# Patient Record
Sex: Male | Born: 1935 | Race: White | Hispanic: No | Marital: Married | Smoking: Former smoker
Health system: Southern US, Community
[De-identification: ages and names within clinical notes are randomized; demographics above are authoritative.]

## PROBLEM LIST (undated history)

## (undated) DIAGNOSIS — Z973 Presence of spectacles and contact lenses: Secondary | ICD-10-CM

## (undated) DIAGNOSIS — I10 Essential (primary) hypertension: Secondary | ICD-10-CM

## (undated) DIAGNOSIS — H269 Unspecified cataract: Secondary | ICD-10-CM

## (undated) DIAGNOSIS — M1990 Unspecified osteoarthritis, unspecified site: Secondary | ICD-10-CM

## (undated) DIAGNOSIS — J189 Pneumonia, unspecified organism: Secondary | ICD-10-CM

## (undated) HISTORY — PX: MULTIPLE TOOTH EXTRACTIONS: SHX2053

## (undated) HISTORY — PX: NASAL FRACTURE SURGERY: SHX718

---

## 2009-04-30 ENCOUNTER — Emergency Department: Payer: Self-pay | Admitting: Emergency Medicine

## 2013-04-13 ENCOUNTER — Emergency Department (HOSPITAL_BASED_OUTPATIENT_CLINIC_OR_DEPARTMENT_OTHER)
Admission: EM | Admit: 2013-04-13 | Discharge: 2013-04-13 | Disposition: A | Discharge status: Home / Self Care | Payer: Medicare Other | Attending: Emergency Medicine | Admitting: Emergency Medicine

## 2013-04-13 ENCOUNTER — Encounter (HOSPITAL_BASED_OUTPATIENT_CLINIC_OR_DEPARTMENT_OTHER): Payer: Self-pay | Admitting: Family Medicine

## 2013-04-13 DIAGNOSIS — I872 Venous insufficiency (chronic) (peripheral): Secondary | ICD-10-CM | POA: Insufficient documentation

## 2013-04-13 DIAGNOSIS — R21 Rash and other nonspecific skin eruption: Secondary | ICD-10-CM | POA: Insufficient documentation

## 2013-04-13 DIAGNOSIS — L03119 Cellulitis of unspecified part of limb: Secondary | ICD-10-CM | POA: Insufficient documentation

## 2013-04-13 DIAGNOSIS — L03115 Cellulitis of right lower limb: Secondary | ICD-10-CM

## 2013-04-13 DIAGNOSIS — I878 Other specified disorders of veins: Secondary | ICD-10-CM

## 2013-04-13 DIAGNOSIS — L97809 Non-pressure chronic ulcer of other part of unspecified lower leg with unspecified severity: Secondary | ICD-10-CM | POA: Insufficient documentation

## 2013-04-13 DIAGNOSIS — L02419 Cutaneous abscess of limb, unspecified: Secondary | ICD-10-CM | POA: Insufficient documentation

## 2013-04-13 MED ORDER — TRAMADOL HCL 50 MG PO TABS: 50.0000 mg | ORAL_TABLET | Freq: Four times a day (QID) | ORAL | Status: DC | PRN

## 2013-04-13 MED ORDER — SULFAMETHOXAZOLE-TMP DS 800-160 MG PO TABS
1.0000 | ORAL_TABLET | Freq: Once | ORAL | Status: AC
  Administered 2013-04-13: 1 via ORAL
  Filled 2013-04-13: qty 1

## 2013-04-13 MED ORDER — CEPHALEXIN 500 MG PO CAPS: 500.0000 mg | ORAL_CAPSULE | Freq: Four times a day (QID) | ORAL | Status: DC

## 2013-04-13 MED ORDER — CEPHALEXIN 250 MG PO CAPS
500.0000 mg | ORAL_CAPSULE | Freq: Once | ORAL | Status: AC
  Administered 2013-04-13: 500 mg via ORAL
  Filled 2013-04-13: qty 2

## 2013-04-13 MED ORDER — SULFAMETHOXAZOLE-TRIMETHOPRIM 800-160 MG PO TABS: 1.0000 | ORAL_TABLET | Freq: Two times a day (BID) | ORAL | Status: DC

## 2013-04-13 NOTE — ED Notes (Signed)
MD at bedside. 

## 2013-04-13 NOTE — ED Notes (Signed)
Snack and PO fluids provided.

## 2013-04-13 NOTE — ED Provider Notes (Signed)
History    CSN: [id number] Arrival date & time 04/13/13  1354  First MD Initiated Contact with Patient 04/13/13 1409     Chief Complaint  Patient presents with  . Leg Problem   (Consider location/radiation/quality/duration/timing/severity/associated sxs/prior Treatment) HPI  78 year old male presents for evaluations of lower leg pain and redness. Patient reports 7 or 8 months ago he was walking down the ER when a "handful of beetles attack both of my legs and bite it".  Subsequent to the incident he notice pain and swelling to both lower legs. Describe pain as sharp and burning sensation, persistent, with associated swelling to his feet. He has been seen and evaluated for this complaint both at the ER and currently at wound care center.  States he's been treated for cellulitis with both IV antibiotics and oral antibiotics. Last antibiotic use was a week ago, when he took Keflex for 10 days. States antibiotic has helped with his symptoms but the symptom has not fully resolved. He visits wound care center on a weekly basis, last visit was yesterday where they released him because the symptom has "gotten better". Patient is here because he felt his infection has not fully resolved and request for further treatment. Patient otherwise denies fever, chest pain, shortness of breath, worsening redness. He takes Advil on occasion for pain. Patient is currently living out of his [employer] due to inability to pay rent.     History reviewed. No pertinent past medical history. History reviewed. No pertinent past surgical history. No family history on file. History  Substance Use Topics  . Smoking status: Never Smoker   . Smokeless tobacco: Not on file  . Alcohol Use: No    Review of Systems  Constitutional: Negative for fever.  HENT: Negative for neck pain.   Respiratory: Negative for shortness of breath.   Cardiovascular: Negative for chest pain.  Gastrointestinal: Negative for nausea, vomiting and  abdominal pain.  Skin: Positive for rash and wound.  All other systems reviewed and are negative.    Allergies  Review of patient's allergies indicates no known allergies.  Home Medications  No current outpatient prescriptions on file. BP 167/82  Pulse 80  Temp(Src) 98 F (36.7 C) (Oral)  Resp 20  SpO2 100% Physical Exam  Nursing note and vitals reviewed. Constitutional: He is oriented to person, place, and time. He appears well-developed and well-nourished. No distress.  HENT:  Head: Normocephalic and atraumatic.  Mouth/Throat: Oropharynx is clear and moist.  Eyes: Conjunctivae are normal.  Neck: Neck supple.  Cardiovascular: Normal rate and regular rhythm.   Pulmonary/Chest: Effort normal and breath sounds normal. He has no wheezes. He exhibits no tenderness.  Abdominal: Soft.  Neurological: He is alert and oriented to person, place, and time.  Skin: Rash (bilateral lower extremities with cellulitic skin changes that is circumferential L>R.  a dime size ulceration noted to R anterior lower leg, no pus.  edema noted to both foot.  both ankle nontender.  DP palpable) noted.    ED Course  Procedures (including critical care time)  2:48 PM Pt with apparent skin injury from beatle bites thus introducing infection including cellulitis to both lower legs.  Sxs seems to be improving but not fully resolved.  Pain has also improved but not resolved.  Cellulitic changes noted to both legs.  Doubt osteo as pt has no fever, no worsening of sxs.  He is primarily here because wound care center released him yesterday after removing his dressing and  reapplied zinc oxide cream.  Pt is NVI, and no evidence of joint involvement.  My plan is to start pt on keflex/bactrim, give pain medication, and have pt return to wound care specialist for further management as needed.  Cellulitic skin changes was marked with marking pen.  Pt should return in 2 days if no improvement.  Care discussed with  attending.    2:58 PM I have reviewed pt prior record, when he was admitted a month ago.  The note indicate pt is likely having chronic venous stasis with possible cellulitis.  Pt is to continue using Ulna boot, take clindamycin, and f/u at the W[facility] Care Clinic.  Note was through Dr. Gery Pray.  I therefore strongly recommend pt to follow prior instruction and to return to his doctor for further care.    Labs Reviewed - No data to display No results found. 1. Venous stasis of lower extremity   2. Bilateral lower leg cellulitis     MDM  BP 167/82  Pulse 80  Temp(Src) 98 F (36.7 C) (Oral)  Resp 20  SpO2 100%  I have reviewed nursing notes and vital signs.   I reviewed available ER/hospitalization records thought the EMR   Fayrene Helper, [place] 04/13/13 1532

## 2013-04-13 NOTE — ED Notes (Signed)
Pt reports he has been inpatient at [facility] intermittently for several months.  After d/c pt has been in a skilled nursing facility for rehab and following up with the wound care center in [facility].  States he was d/c'd from wound care center yesterday but wounds on bilateral LE are not healing.  Pt is presently homeless and living in his [employer]. Records requested from [facility].

## 2013-04-13 NOTE — ED Notes (Signed)
Pt c/o bilateral lower leg red and swollen and has been treated for wounds at [facility] but released yesterday.

## 2013-04-13 NOTE — ED Notes (Signed)
Bilateral LE wrapped with ace wraps for compression.  Bacitracin applied to dime sized open area on anterior aspect of leg.

## 2013-04-14 NOTE — ED Provider Notes (Signed)
Medical screening examination/treatment/procedure(s) were performed by non-physician practitioner and as supervising physician I was immediately available for consultation/collaboration.   Charles B. Sheldon, MD 04/14/13 1104 

## 2013-04-17 ENCOUNTER — Emergency Department (HOSPITAL_BASED_OUTPATIENT_CLINIC_OR_DEPARTMENT_OTHER): Payer: Medicare Other

## 2013-04-17 ENCOUNTER — Encounter (HOSPITAL_BASED_OUTPATIENT_CLINIC_OR_DEPARTMENT_OTHER): Payer: Self-pay | Admitting: Emergency Medicine

## 2013-04-17 ENCOUNTER — Other Ambulatory Visit (HOSPITAL_BASED_OUTPATIENT_CLINIC_OR_DEPARTMENT_OTHER): Payer: Medicare Other

## 2013-04-17 ENCOUNTER — Emergency Department (HOSPITAL_BASED_OUTPATIENT_CLINIC_OR_DEPARTMENT_OTHER)
Admission: EM | Admit: 2013-04-17 | Discharge: 2013-04-18 | Disposition: A | Discharge status: Home / Self Care | Payer: Medicare Other | Attending: Emergency Medicine | Admitting: Emergency Medicine

## 2013-04-17 DIAGNOSIS — N289 Disorder of kidney and ureter, unspecified: Secondary | ICD-10-CM

## 2013-04-17 DIAGNOSIS — I8310 Varicose veins of unspecified lower extremity with inflammation: Secondary | ICD-10-CM | POA: Insufficient documentation

## 2013-04-17 DIAGNOSIS — L539 Erythematous condition, unspecified: Secondary | ICD-10-CM | POA: Insufficient documentation

## 2013-04-17 DIAGNOSIS — I872 Venous insufficiency (chronic) (peripheral): Secondary | ICD-10-CM

## 2013-04-17 DIAGNOSIS — Z87891 Personal history of nicotine dependence: Secondary | ICD-10-CM | POA: Insufficient documentation

## 2013-04-17 LAB — CBC WITH DIFFERENTIAL/PLATELET
Basophils Absolute: 0.1 10*3/uL (ref 0.0–0.1)
Basophils Relative: 1 % (ref 0–1)
Eosinophils Absolute: 0.1 10*3/uL (ref 0.0–0.7)
HCT: 38.9 % — ABNORMAL LOW (ref 39.0–52.0)
Hemoglobin: 13 g/dL (ref 13.0–17.0)
Lymphocytes Relative: 17 % (ref 12–46)
MCHC: 33.4 g/dL (ref 30.0–36.0)
Monocytes Relative: 12 % (ref 3–12)
Neutro Abs: 5.7 10*3/uL (ref 1.7–7.7)
Neutrophils Relative %: 69 % (ref 43–77)
WBC: 8.3 10*3/uL (ref 4.0–10.5)

## 2013-04-17 LAB — COMPREHENSIVE METABOLIC PANEL
ALT: 16 U/L (ref 0–53)
Albumin: 3.9 g/dL (ref 3.5–5.2)
Alkaline Phosphatase: 62 U/L (ref 39–117)
Chloride: 99 mEq/L (ref 96–112)
Glucose, Bld: 95 mg/dL (ref 70–99)
Potassium: 4.5 mEq/L (ref 3.5–5.1)
Sodium: 138 mEq/L (ref 135–145)
Total Bilirubin: 0.3 mg/dL (ref 0.3–1.2)
Total Protein: 7.1 g/dL (ref 6.0–8.3)

## 2013-04-17 NOTE — Discharge Instructions (Signed)
Venous Stasis and Chronic Venous Insufficiency Continue to take the antibiotics. Followup with the wound center as scheduled. Have your doctor recheck your kidney function next week. Return to the ED if you develop new or worsening symptoms. As people age, the veins located in their legs may weaken and stretch. When veins weaken and lose the ability to pump blood effectively, the condition is called chronic venous insufficiency (CVI) or venous stasis. Almost all veins return blood back to the heart. This happens by:  The force of the heart pumping fresh blood pushes blood back to the heart.  Blood flowing to the heart from the force of gravity. In the deep veins of the legs, blood has to fight gravity and flow upstream back to the heart. Here, the leg muscles contract to pump blood back toward the heart. Vein walls are elastic, and many veins have small valves that only allow blood to flow in one direction. When leg muscles contract, they push inward against the elastic vein walls. This squeezes blood upward, opens the valves, and moves blood toward the heart. When leg muscles relax, the vein wall also relaxes and the valves inside the vein close to prevent blood from flowing backward. This method of pumping blood out of the legs is called the venous pump. CAUSES  The venous pump works best while walking and leg muscles are contracting. But when a person sits or stands, blood pressure in leg veins can build. Deep veins are usually able to withstand short periods of inactivity, but long periods of inactivity (and increased pressure) can stretch, weaken, and damage vein walls. High blood pressure can also stretch and damage vein walls. The veins may no longer be able to pump blood back to the heart. Venous hypertension (high blood pressure inside veins) that lasts over time is a primary cause of CVI. CVI can also be caused by:   Deep vein thrombosis, a condition where a thrombus (blood clot) blocks blood  flow in a vein.  Phlebitis, an inflammation of a superficial vein that causes a blood clot to form. Other risk factors for CVI may include:   Heredity.  Obesity.  Pregnancy.  Sedentary lifestyle.  Smoking.  Jobs requiring long periods of standing or sitting in one place.  Age and gender:  Women in their 22's and 9's and men in their 77's are more prone to developing CVI. SYMPTOMS  Symptoms of CVI may include:   Varicose veins.  Ulceration or skin breakdown.  Lipodermatosclerosis, a condition that affects the skin just above the ankle, usually on the inside surface. Over time the skin becomes brown, smooth, tight and often painful. Those with this condition have a high risk of developing skin ulcers.  Reddened or discolored skin on the leg.  Swelling. DIAGNOSIS  Your caregiver can diagnose CVI after performing a careful medical history and physical examination. To confirm the diagnosis, the following tests may also be ordered:   Duplex ultrasound.  Plethysmography (tests blood flow).  Venograms (x-ray using a special dye). TREATMENT The goals of treatment for CVI are to restore a person to an active life and to minimize pain or disability. Typically, CVI does not pose a serious threat to life or limb, and with proper treatment most people with this condition can continue to lead active lives. In most cases, mild CVI can be treated on an outpatient basis with simple procedures. Treatment methods include:   Elastic compression socks.  Sclerotherapy, a procedure involving an injection of a  material that "dissolves" the damaged veins. Other veins in the network of blood vessels take over the function of the damaged veins.  Vein stripping (an older procedure less commonly used).  Laser Ablation surgery.  Valve repair. HOME CARE INSTRUCTIONS   Elastic compression socks must be worn every day. They can help with symptoms and lower the chances of the problem getting  worse, but they do not cure the problem.  Only take over-the-counter or prescription medicines for pain, discomfort, or fever as directed by your caregiver.  Your caregiver will review your other medications with you. SEEK MEDICAL CARE IF:   You are confused about how to take your medications.  There is redness, swelling, or increasing pain in the affected area.  There is a red streak or line that extends up or down from the affected area.  There is a breakdown or loss of skin in the affected area, even if the breakdown is small.  You develop an unexplained oral temperature above 102 F (38.9 C).  There is an injury to the affected area. SEEK IMMEDIATE MEDICAL CARE IF:   There is an injury and open wound to the affected area.  Pain is not adequately relieved with pain medication prescribed or becomes severe.  An oral temperature above 102 F (38.9 C) develops.  The foot/ankle below the affected area becomes suddenly numb or the area feels weak and hard to move. MAKE SURE YOU:   Understand these instructions.  Will watch your condition.  Will get help right away if you are not doing well or get worse. Document Released: 01/26/2007 Document Revised: 12/15/2011 Document Reviewed: 04/05/2007 [facility] Patient Information 2014 [place], [place].

## 2013-04-17 NOTE — ED Notes (Signed)
Patient transported to Ultrasound 

## 2013-04-17 NOTE — ED Notes (Signed)
Pt returned from xray via stretcher. Pt appears in no distress.

## 2013-04-17 NOTE — ED Provider Notes (Signed)
History  This chart was scribed for Gerald Octave, MD, by Yevette Edwards, ED Scribe. This patient was seen in room MH10/MH10 and the patient's care was started at 8:39 PM.  CSN: [id number] Arrival date & time 04/17/13  7829  First MD Initiated Contact with Patient 04/17/13 2029     Chief Complaint  Patient presents with  . Wound Check    The history is provided by the patient. No language interpreter was used.   HPI Comments: Gerald Lewis is a 78 y.o. male who presents to the Emergency Department complaining of chronic bilateral wounds to his lower extremities which have been consistently red and swollen. The pt states that his legs were bitten by multiple beetles several months ago, and he subsequently developed cellulitis. He states that the wounds have been draining, though they were not draining at bedside. He also states that his wounds have neither worsened nor improved recently, and that the redness of his legs are baseline since the wounds occurred.He denies nausea, emesis, fever, chest pain, or SOB. He reports that he has been admitted to [facility] three times, with his last admission approximately one month ago. He also reports that he has also visited the Wound Center as well as this ED recently where he received antibiotics.  The pt has an appointment with the Wound Center in two days.  He denies a h/o of DM. He is a former smoker, and he denies using alcohol.   History reviewed. No pertinent past medical history. History reviewed. No pertinent past surgical history. No family history on file. History  Substance Use Topics  . Smoking status: Former Games developer  . Smokeless tobacco: Not on file  . Alcohol Use: No    Review of Systems  Constitutional: Negative for fever and chills.  Respiratory: Negative for shortness of breath.   Cardiovascular: Negative for chest pain.  Gastrointestinal: Negative for nausea and vomiting.  Skin: Positive for color change  and wound.  All other systems reviewed and are negative.    Allergies  Review of patient's allergies indicates no known allergies.  Home Medications   Current Outpatient Rx  Name  Route  Sig  Dispense  Refill  . cephALEXin (KEFLEX) 500 MG capsule   Oral   Take 1 capsule (500 mg total) by mouth 4 (four) times daily.   28 capsule   0   . sulfamethoxazole-trimethoprim (SEPTRA DS) 800-160 MG per tablet   Oral   Take 1 tablet by mouth every 12 (twelve) hours.   10 tablet   0   . traMADol (ULTRAM) 50 MG tablet   Oral   Take 1 tablet (50 mg total) by mouth every 6 (six) hours as needed for pain.   15 tablet   0    Triage Vitals: BP 128/65  Pulse 94  Temp(Src) 97.7 F (36.5 C) (Oral)  Resp 16  SpO2 97%  Physical Exam  Nursing note and vitals reviewed. Constitutional: He is oriented to person, place, and time. He appears well-developed and well-nourished. No distress.  HENT:  Head: Normocephalic and atraumatic.  Eyes: EOM are normal.  Neck: Normal range of motion. Neck supple. No tracheal deviation present.  Cardiovascular: Normal rate.   Pulmonary/Chest: Effort normal. No respiratory distress.  Abdominal: Soft. There is no tenderness.  Musculoskeletal: Normal range of motion.  Neurological: He is alert and oriented to person, place, and time.  Skin: Skin is warm and dry. There is erythema.  Right leg: Mild,  circumferential erythema of medial mid tibia, circumferential. 1 cm denuded blister on anterior shin. Intact DP and PT pulses.   Left leg: Circumferential erythema of distal third of tibia with crusting legions consistent with venous stasis changes. + 2 DP pulse.   Psychiatric: He has a normal mood and affect. His behavior is normal.    ED Course  Procedures (including critical care time)  DIAGNOSTIC STUDIES: Oxygen Saturation is 97% on room air, normal by my interpretation.    COORDINATION OF CARE:  8:40 PM-Discussed treatment plan with patient, and the  patient agreed to the plan.    Labs Reviewed  CBC WITH DIFFERENTIAL - Abnormal; Notable for the following:    HCT 38.9 (*)    All other components within normal limits  COMPREHENSIVE METABOLIC PANEL - Abnormal; Notable for the following:    BUN 24 (*)    Creatinine, Ser 1.80 (*)    GFR calc non Af Amer 35 (*)    GFR calc Af Amer 40 (*)    All other components within normal limits   Dg Tibia/fibula Left  04/17/2013   *RADIOLOGY REPORT*  Clinical Data: Wound check.  LEFT TIBIA AND FIBULA - 2 VIEW  Comparison: None.  Findings: Two views of the left lower leg were obtained.  Joint space narrowing and degenerative changes in the medial knee compartment.  Negative for fracture or dislocation.  No evidence for a radiopaque foreign body. Small spur on the calcaneus. Enthesopathic changes involving the calcaneus.  IMPRESSION: No acute bony abnormality.  Degenerative changes in the left knee.   Original Report Authenticated By: Richarda Overlie, M.D.   Dg Tibia/fibula Right  04/17/2013   *RADIOLOGY REPORT*  Clinical Data: Wound check.  RIGHT TIBIA AND FIBULA - 2 VIEW  Comparison: None.  Findings: Two views of the right lower leg were obtained. Degenerative changes in the medial knee compartment.  No evidence for a fracture or dislocation.  No evidence for a radiopaque foreign body.  IMPRESSION: No acute bony abnormality.  Degenerative changes in the right knee.   Original Report Authenticated By: Richarda Overlie, M.D.   US Venous Img Lower Bilateral  04/17/2013   *RADIOLOGY REPORT*  Clinical Data: Leg pain and swelling.  VENOUS DUPLEX ULTRASOUND OF BILATERAL LOWER EXTREMITIES  Technique:  Gray-scale sonography with graded compression, as well as color Doppler and duplex ultrasound, were performed to evaluate the deep venous system of both lower extremities from the level of the common femoral vein through the popliteal and proximal calf veins.  Spectral Doppler was utilized to evaluate flow at rest and with distal  augmentation maneuvers.  Comparison:  None.  Findings: Bilaterally, the lower common femoral vein, upper great saphenous vein, upper femoral profunda, superficial femoral vein, and popliteal veins, are patent by gray scale with compression, spectral tracing with augmentation, and color Doppler - as permitted by patient anatomy. Evaluation of the upper calf veins limited due to open wounds on the calves.   Preserved respiratory phasicity.  IMPRESSION: Negative for deep venous thrombosis in either lower extremity.   Original Report Authenticated By: Tiburcio Pea   No diagnosis found.  MDM  Chronic lower extremity wounds for which he follows at the wound Center. Seen 3 days ago and started on Bactrim and Keflex. Has wound Center followup in 2 days  Records obtained from [facility]. He was admitted on June 2 for venous stasis ulcers with possible cellulitis. It is recommended that he follow up with vascular surgery for varicose  vein removal.   No evidence of DVT on ultrasound today. No bony abnormality. Patient's dressings were removed and appeared to be quite tight. Erythema improved after dressing removal. Wounds do not appear to be any worse than description 3 days ago. Patient is hemodynamically stable and able to follow up at the wound center as scheduled. He will need recheck of his creatinine which is elevated today at 1.8.  I personally performed the services described in this documentation, which was scribed in my presence. The recorded information has been reviewed and is accurate.    Gerald Octave, MD 04/18/13 [phone]

## 2013-04-17 NOTE — ED Notes (Addendum)
Pt here for wound check; was seen here on 7/9 and referred to a wound care center; has an appt on Tues. Pt is concerned because he is not able to provide wound care d/t he is living in his [employer]. The dressings on BLE have not been changed since 7/9.

## 2013-04-17 NOTE — ED Notes (Signed)
Assigned RN and MD at bedside on vital sign re-assessment

## 2013-07-06 ENCOUNTER — Emergency Department (HOSPITAL_BASED_OUTPATIENT_CLINIC_OR_DEPARTMENT_OTHER)
Admission: EM | Admit: 2013-07-06 | Discharge: 2013-07-06 | Disposition: A | Discharge status: Home / Self Care | Payer: Medicare Other | Attending: Emergency Medicine | Admitting: Emergency Medicine

## 2013-07-06 ENCOUNTER — Encounter (HOSPITAL_BASED_OUTPATIENT_CLINIC_OR_DEPARTMENT_OTHER): Payer: Self-pay | Admitting: Emergency Medicine

## 2013-07-06 DIAGNOSIS — Z5189 Encounter for other specified aftercare: Secondary | ICD-10-CM

## 2013-07-06 DIAGNOSIS — Z4800 Encounter for change or removal of nonsurgical wound dressing: Secondary | ICD-10-CM | POA: Insufficient documentation

## 2013-07-06 DIAGNOSIS — I10 Essential (primary) hypertension: Secondary | ICD-10-CM | POA: Insufficient documentation

## 2013-07-06 DIAGNOSIS — Z87891 Personal history of nicotine dependence: Secondary | ICD-10-CM | POA: Insufficient documentation

## 2013-07-06 HISTORY — DX: Essential (primary) hypertension: I10

## 2013-07-06 LAB — BASIC METABOLIC PANEL
CO2: 26 mEq/L (ref 19–32)
Calcium: 8.7 mg/dL (ref 8.4–10.5)
Creatinine, Ser: 1 mg/dL (ref 0.50–1.35)
GFR calc non Af Amer: 71 mL/min — ABNORMAL LOW (ref >90)
Glucose, Bld: 106 mg/dL — ABNORMAL HIGH (ref 70–99)

## 2013-07-06 MED ORDER — LISINOPRIL-HYDROCHLOROTHIAZIDE 20-25 MG PO TABS: 1.0000 | ORAL_TABLET | Freq: Every day | ORAL | Status: DC

## 2013-07-06 NOTE — ED Provider Notes (Signed)
CSN: [id number]     Arrival date & time 07/06/13  1627 History   First MD Initiated Contact with Patient 07/06/13 1640     Chief Complaint  Patient presents with  . Wound Check   (Consider location/radiation/quality/duration/timing/severity/associated sxs/prior Treatment) HPI Comments: Pt states that he has been being seen by the wound clinic for ulcer and they took him out of the unaboot and told him to get stockings:pt states that he wants a second opinion on whether he needs the stocking:pt is also here to have his bp medication refilled:he hasn't had it in one week:denies cp or sob  The history is provided by the patient. No language interpreter was used.    Past Medical History  Diagnosis Date  . Hypertension    Past Surgical History  Procedure Laterality Date  . Nasal fracture surgery     History reviewed. No pertinent family history. History  Substance Use Topics  . Smoking status: Former Games developer  . Smokeless tobacco: Not on file  . Alcohol Use: No    Review of Systems  Constitutional: Negative.   Respiratory: Negative.   Cardiovascular: Negative.     Allergies  Review of patient's allergies indicates no known allergies.  Home Medications   Current Outpatient Rx  Name  Route  Sig  Dispense  Refill  . lisinopril-hydrochlorothiazide (PRINZIDE,ZESTORETIC) 20-25 MG per tablet   Oral   Take 1 tablet by mouth daily.   30 tablet   1    BP 195/83  Pulse 98  Temp(Src) 97.9 F (36.6 C) (Oral)  Resp 18  Wt 220 lb (99.791 kg)  SpO2 98% Physical Exam  Nursing note and vitals reviewed. Constitutional: He appears well-developed and well-nourished.  Cardiovascular: Normal rate and regular rhythm.   Pulmonary/Chest: Effort normal and breath sounds normal.  Musculoskeletal: Normal range of motion.  Skin:  Redness noted to lower legs bilaterally:no drainage or wounds noted    ED Course  Procedures (including critical care time) Labs Review Labs Reviewed  BASIC  METABOLIC PANEL - Abnormal; Notable for the following:    Glucose, Bld 106 (*)    GFR calc non Af Amer 71 (*)    GFR calc Af Amer 82 (*)    All other components within normal limits   Imaging Review No results found.  MDM   1. HTN (hypertension)   2. Visit for wound check    Will give pt a script for his bp medication    Teressa Lower, NP 07/06/13 1756

## 2013-07-06 NOTE — ED Notes (Signed)
NP at bedside.

## 2013-07-06 NOTE — ED Notes (Signed)
Pt seen here and prescribed antbx for lower extremity redness and swelling. Also followed up w/wound center. Had Unaboots removed yesterday. Also concerned about HTN follow up

## 2013-07-07 NOTE — ED Provider Notes (Signed)
Medical screening examination/treatment/procedure(s) were performed by non-physician practitioner and as supervising physician I was immediately available for consultation/collaboration.  Shon Baton, MD 07/07/13 [phone]

## 2017-03-11 ENCOUNTER — Encounter (HOSPITAL_COMMUNITY): Payer: Self-pay

## 2017-03-11 ENCOUNTER — Ambulatory Visit (HOSPITAL_COMMUNITY)
Admission: RE | Admit: 2017-03-11 | Discharge: 2017-03-11 | Disposition: A | Discharge status: Home / Self Care | Payer: Medicare Other | Source: Ambulatory Visit | Attending: Orthopaedic Surgery | Admitting: Orthopaedic Surgery

## 2017-03-11 ENCOUNTER — Encounter (HOSPITAL_COMMUNITY)
Admission: RE | Admit: 2017-03-11 | Discharge: 2017-03-11 | Disposition: A | Discharge status: Home / Self Care | Payer: Medicare Other | Source: Ambulatory Visit | Attending: Orthopaedic Surgery | Admitting: Orthopaedic Surgery

## 2017-03-11 DIAGNOSIS — Z01818 Encounter for other preprocedural examination: Secondary | ICD-10-CM | POA: Diagnosis present

## 2017-03-11 DIAGNOSIS — Z01810 Encounter for preprocedural cardiovascular examination: Secondary | ICD-10-CM | POA: Diagnosis not present

## 2017-03-11 DIAGNOSIS — Z01812 Encounter for preprocedural laboratory examination: Secondary | ICD-10-CM | POA: Insufficient documentation

## 2017-03-11 HISTORY — DX: Pneumonia, unspecified organism: J18.9

## 2017-03-11 HISTORY — DX: Unspecified osteoarthritis, unspecified site: M19.90

## 2017-03-11 HISTORY — DX: Presence of spectacles and contact lenses: Z97.3

## 2017-03-11 HISTORY — DX: Unspecified cataract: H26.9

## 2017-03-11 LAB — SURGICAL PCR SCREEN
MRSA, PCR: NEGATIVE
STAPHYLOCOCCUS AUREUS: NEGATIVE

## 2017-03-11 LAB — ABO/RH: ABO/RH(D): O POS

## 2017-03-11 LAB — BASIC METABOLIC PANEL
Anion gap: 8 (ref 5–15)
BUN: 19 mg/dL (ref 6–20)
CO2: 24 mmol/L (ref 22–32)
CREATININE: 0.96 mg/dL (ref 0.61–1.24)
Calcium: 8.7 mg/dL — ABNORMAL LOW (ref 8.9–10.3)
Chloride: 105 mmol/L (ref 101–111)
GFR calc Af Amer: >60 mL/min (ref >60)
GFR calc non Af Amer: >60 mL/min (ref >60)
GLUCOSE: 93 mg/dL (ref 65–99)
Potassium: 4 mmol/L (ref 3.5–5.1)
Sodium: 137 mmol/L (ref 135–145)

## 2017-03-11 LAB — CBC WITH DIFFERENTIAL/PLATELET
Basophils Absolute: 0 10*3/uL (ref 0.0–0.1)
Basophils Relative: 1 %
EOS ABS: 0.2 10*3/uL (ref 0.0–0.7)
EOS PCT: 3 %
HCT: 36.1 % — ABNORMAL LOW (ref 39.0–52.0)
Hemoglobin: 11.6 g/dL — ABNORMAL LOW (ref 13.0–17.0)
Lymphocytes Relative: 23 %
Lymphs Abs: 1.9 10*3/uL (ref 0.7–4.0)
MCH: 26.5 pg (ref 26.0–34.0)
MCHC: 32.1 g/dL (ref 30.0–36.0)
MCV: 82.6 fL (ref 78.0–100.0)
MONOS PCT: 7 %
Monocytes Absolute: 0.5 10*3/uL (ref 0.1–1.0)
Neutro Abs: 5.5 10*3/uL (ref 1.7–7.7)
Neutrophils Relative %: 66 %
PLATELETS: 366 10*3/uL (ref 150–400)
RBC: 4.37 MIL/uL (ref 4.22–5.81)
RDW: 15.8 % — ABNORMAL HIGH (ref 11.5–15.5)
WBC: 8.2 10*3/uL (ref 4.0–10.5)

## 2017-03-11 LAB — URINALYSIS, ROUTINE W REFLEX MICROSCOPIC
Bilirubin Urine: NEGATIVE
Glucose, UA: NEGATIVE mg/dL
Hgb urine dipstick: NEGATIVE
KETONES UR: NEGATIVE mg/dL
LEUKOCYTES UA: NEGATIVE
NITRITE: NEGATIVE
Protein, ur: NEGATIVE mg/dL
SPECIFIC GRAVITY, URINE: 1.027 (ref 1.005–1.030)
pH: 5 (ref 5.0–8.0)

## 2017-03-11 LAB — APTT: aPTT: 29 seconds (ref 24–36)

## 2017-03-11 LAB — PROTIME-INR
INR: 1.01
PROTHROMBIN TIME: 13.3 s (ref 11.4–15.2)

## 2017-03-11 NOTE — Pre-Procedure Instructions (Signed)
    Gerald Lewis  03/11/2017      [facility] - [place], [place] - [id number] [place] [id number] Phone: [phone] Fax: [phone]   Your procedure is scheduled on Tuesday, March 17, 2017  Report to [facility] Admitting at 8:20 A.M.  Call this number if you have problems the morning of surgery:  [phone]   Remember:  Do not eat food or drink liquids after midnight Monday, March 16, 2017  Take these medicines the morning of surgery with A SIP OF WATER: None Stop taking Aspirin, vitamins, fish oil, Turmeric, Ginkgo Biloba, Saw Palmetto and herbal medications. Do not take any NSAIDs ie: Ibuprofen, Advil, Naproxen, BC and Goody Powder or any medication containing Aspirin; stop now.  Do not wear jewelry, make-up or nail polish.  Do not wear lotions, powders, or perfumes, or deoderant.  Do not shave 48 hours prior to surgery.  Men may shave face and neck.  Do not bring valuables to the hospital.  [facility] is not responsible for any belongings or valuables.  Contacts, dentures or bridgework may not be worn into surgery.  Leave your suitcase in the car.  After surgery it may be brought to your room. For patients admitted to the hospital, discharge time will be determined by your treatment team. Special instructions: Shower the night before surgery and the morning of surgery with CHG. Please read over the following fact sheets that you were given. Pain Booklet, Coughing and Deep Breathing, Blood Transfusion Information, Total Joint Packet, MRSA Information and Surgical Site Infection Prevention

## 2017-03-11 NOTE — Progress Notes (Signed)
Pt denies SOB, chest pain, and being under the care of a cardiologist. Pt denies having a stress test, echo and cardiac cath. Pt denies having an EKG and chest x ray within the last year. Pt denies having recent labs. Pt chart forwarded to anesthesia for review (ekg,cxr ?)

## 2017-03-11 NOTE — H&P (Signed)
TOTAL KNEE ADMISSION H&P  Patient is being admitted for left total knee arthroplasty.  Subjective:  Chief Complaint:left knee pain.  HPI: Gerald Lewis, 82 y.o. male, has a history of pain and functional disability in the left knee due to arthritis and has failed non-surgical conservative treatments for greater than 12 weeks to includeNSAID's and/or analgesics, corticosteriod injections, flexibility and strengthening excercises, use of assistive devices, weight reduction as appropriate and activity modification.  Onset of symptoms was gradual, starting 5 years ago with gradually worsening course since that time. The patient noted no past surgery on the left knee(s).  Patient currently rates pain in the left knee(s) at 10 out of 10 with activity. Patient has night pain, worsening of pain with activity and weight bearing, pain that interferes with activities of daily living, crepitus and joint swelling.  Patient has evidence of subchondral cysts, subchondral sclerosis, periarticular osteophytes and joint space narrowing by imaging studies. There is no active infection.  There are no active problems to display for this patient.  Past Medical History:  Diagnosis Date  . Hypertension     Past Surgical History:  Procedure Laterality Date  . NASAL FRACTURE SURGERY      No prescriptions prior to admission.   No Known Allergies  Social History  Substance Use Topics  . Smoking status: Former Games developer  . Smokeless tobacco: Not on file  . Alcohol use No    No family history on file.   Review of Systems  Musculoskeletal: Positive for joint pain.       Left knee  All other systems reviewed and are negative.   Objective:  Physical Exam  Constitutional: He is oriented to person, place, and time. He appears well-developed and well-nourished.  HENT:  Head: Normocephalic and atraumatic.  Eyes: Pupils are equal, round, and reactive to light.  Neck: Normal range of motion.  Cardiovascular:  Normal rate and regular rhythm.   Respiratory: Effort normal.  GI: Soft.  Musculoskeletal:  Examination of the left knee shows range of motion from 0 to about 100 of flexion.  He is tender to palpation along the medial joint line.  No significant effusion.  Normal sensation motor function throughout his leg.  He is neurovascularly intact distally.    Neurological: He is alert and oriented to person, place, and time.  Skin: Skin is warm and dry.  Psychiatric: He has a normal mood and affect. His behavior is normal. Judgment and thought content normal.    Vital signs in last 24 hours:    Labs:   There is no height or weight on file to calculate BMI.   Imaging Review Plain radiographs demonstrate severe degenerative joint disease of the left knee(s). The overall alignment isneutral. The bone quality appears to be good for age and reported activity level.  Assessment/Plan:  End stage primary arthritis, left knee   The patient history, physical examination, clinical judgment of the provider and imaging studies are consistent with end stage degenerative joint disease of the left knee(s) and total knee arthroplasty is deemed medically necessary. The treatment options including medical management, injection therapy arthroscopy and arthroplasty were discussed at length. The risks and benefits of total knee arthroplasty were presented and reviewed. The risks due to aseptic loosening, infection, stiffness, patella tracking problems, thromboembolic complications and other imponderables were discussed. The patient acknowledged the explanation, agreed to proceed with the plan and consent was signed. Patient is being admitted for inpatient treatment for surgery, pain control, PT, OT,  prophylactic antibiotics, VTE prophylaxis, progressive ambulation and ADL's and discharge planning. The patient is planning to be discharged to skilled nursing facility

## 2017-03-16 MED ORDER — TRANEXAMIC ACID 1000 MG/10ML IV SOLN
2000.0000 mg | INTRAVENOUS | Status: AC
  Administered 2017-03-17: 2000 mg via TOPICAL
  Filled 2017-03-16: qty 20

## 2017-03-16 MED ORDER — TRANEXAMIC ACID 1000 MG/10ML IV SOLN
2000.0000 mg | INTRAVENOUS | Status: DC
  Filled 2017-03-16: qty 20

## 2017-03-17 ENCOUNTER — Inpatient Hospital Stay (HOSPITAL_COMMUNITY)
Admission: RE | Admit: 2017-03-17 | Discharge: 2017-03-20 | DRG: 470 | Disposition: A | Discharge status: Skilled Nursing Facility | Payer: Medicare Other | Source: Ambulatory Visit | Attending: Orthopaedic Surgery | Admitting: Orthopaedic Surgery

## 2017-03-17 ENCOUNTER — Encounter (HOSPITAL_COMMUNITY): Payer: Self-pay | Admitting: Certified Registered"

## 2017-03-17 ENCOUNTER — Inpatient Hospital Stay (HOSPITAL_COMMUNITY): Payer: Medicare Other | Admitting: Certified Registered"

## 2017-03-17 ENCOUNTER — Inpatient Hospital Stay (HOSPITAL_COMMUNITY): Payer: Medicare Other | Admitting: Emergency Medicine

## 2017-03-17 ENCOUNTER — Encounter (HOSPITAL_COMMUNITY)
Admission: RE | Disposition: A | Discharge status: Skilled Nursing Facility | Payer: Self-pay | Source: Ambulatory Visit | Attending: Orthopaedic Surgery

## 2017-03-17 DIAGNOSIS — H269 Unspecified cataract: Secondary | ICD-10-CM | POA: Diagnosis present

## 2017-03-17 DIAGNOSIS — Z79899 Other long term (current) drug therapy: Secondary | ICD-10-CM

## 2017-03-17 DIAGNOSIS — M1712 Unilateral primary osteoarthritis, left knee: Secondary | ICD-10-CM | POA: Diagnosis present

## 2017-03-17 DIAGNOSIS — Z87891 Personal history of nicotine dependence: Secondary | ICD-10-CM

## 2017-03-17 DIAGNOSIS — I10 Essential (primary) hypertension: Secondary | ICD-10-CM | POA: Diagnosis not present

## 2017-03-17 DIAGNOSIS — G8918 Other acute postprocedural pain: Secondary | ICD-10-CM

## 2017-03-17 HISTORY — PX: TOTAL KNEE ARTHROPLASTY: SHX125

## 2017-03-17 SURGERY — ARTHROPLASTY, KNEE, TOTAL
Anesthesia: Regional | Laterality: Left

## 2017-03-17 MED ORDER — MIDAZOLAM HCL 2 MG/2ML IJ SOLN
INTRAMUSCULAR | Status: AC
  Filled 2017-03-17: qty 2

## 2017-03-17 MED ORDER — PHENOL 1.4 % MT LIQD: 1.0000 | OROMUCOSAL | Status: DC | PRN

## 2017-03-17 MED ORDER — CHLORHEXIDINE GLUCONATE 4 % EX LIQD: 60.0000 mL | Freq: Once | CUTANEOUS | Status: DC

## 2017-03-17 MED ORDER — BUPIVACAINE-EPINEPHRINE (PF) 0.5% -1:200000 IJ SOLN
INTRAMUSCULAR | Status: DC | PRN
  Administered 2017-03-17: 20 mL via PERINEURAL

## 2017-03-17 MED ORDER — ONDANSETRON HCL 4 MG PO TABS: 4.0000 mg | ORAL_TABLET | Freq: Four times a day (QID) | ORAL | Status: DC | PRN

## 2017-03-17 MED ORDER — CEFAZOLIN SODIUM-DEXTROSE 2-4 GM/100ML-% IV SOLN
2.0000 g | INTRAVENOUS | Status: AC
  Administered 2017-03-17: 2 g via INTRAVENOUS
  Filled 2017-03-17: qty 100

## 2017-03-17 MED ORDER — SODIUM CHLORIDE 0.9 % IJ SOLN
INTRAMUSCULAR | Status: DC | PRN
  Administered 2017-03-17: 40 mL via INTRAVENOUS

## 2017-03-17 MED ORDER — DOCUSATE SODIUM 100 MG PO CAPS
100.0000 mg | ORAL_CAPSULE | Freq: Two times a day (BID) | ORAL | Status: DC
  Administered 2017-03-17 – 2017-03-20 (×6): 100 mg via ORAL
  Filled 2017-03-17 (×6): qty 1

## 2017-03-17 MED ORDER — BUPIVACAINE IN DEXTROSE 0.75-8.25 % IT SOLN
INTRATHECAL | Status: DC | PRN
  Administered 2017-03-17: 1.6 mL via INTRATHECAL

## 2017-03-17 MED ORDER — HYDROCODONE-ACETAMINOPHEN 5-325 MG PO TABS
1.0000 | ORAL_TABLET | ORAL | Status: DC | PRN
  Administered 2017-03-17 (×2): 1 via ORAL
  Administered 2017-03-18: 2 via ORAL
  Filled 2017-03-17 (×2): qty 1
  Filled 2017-03-17: qty 2
  Filled 2017-03-17: qty 1

## 2017-03-17 MED ORDER — PROPOFOL 500 MG/50ML IV EMUL
INTRAVENOUS | Status: DC | PRN
  Administered 2017-03-17: 50 ug/kg/min via INTRAVENOUS

## 2017-03-17 MED ORDER — LACTATED RINGERS IV SOLN
INTRAVENOUS | Status: DC
  Administered 2017-03-17: 11:00:00 via INTRAVENOUS

## 2017-03-17 MED ORDER — LISINOPRIL 20 MG PO TABS
20.0000 mg | ORAL_TABLET | Freq: Every day | ORAL | Status: DC
  Administered 2017-03-17 – 2017-03-20 (×4): 20 mg via ORAL
  Filled 2017-03-17 (×4): qty 1

## 2017-03-17 MED ORDER — DIPHENHYDRAMINE HCL 12.5 MG/5ML PO ELIX
12.5000 mg | ORAL_SOLUTION | ORAL | Status: DC | PRN
  Administered 2017-03-19: 25 mg via ORAL
  Filled 2017-03-17: qty 10

## 2017-03-17 MED ORDER — 0.9 % SODIUM CHLORIDE (POUR BTL) OPTIME
TOPICAL | Status: DC | PRN
  Administered 2017-03-17: 1000 mL

## 2017-03-17 MED ORDER — TRANEXAMIC ACID 1000 MG/10ML IV SOLN
1000.0000 mg | INTRAVENOUS | Status: AC
  Administered 2017-03-17: 1000 mg via INTRAVENOUS
  Filled 2017-03-17: qty 10

## 2017-03-17 MED ORDER — ONDANSETRON HCL 4 MG/2ML IJ SOLN: 4.0000 mg | Freq: Once | INTRAMUSCULAR | Status: DC | PRN

## 2017-03-17 MED ORDER — LACTATED RINGERS IV SOLN
INTRAVENOUS | Status: DC
  Administered 2017-03-17: 20:00:00 via INTRAVENOUS

## 2017-03-17 MED ORDER — LACTATED RINGERS IV SOLN
INTRAVENOUS | Status: DC
  Administered 2017-03-17: 09:00:00 via INTRAVENOUS

## 2017-03-17 MED ORDER — METOCLOPRAMIDE HCL 5 MG/ML IJ SOLN: 5.0000 mg | Freq: Three times a day (TID) | INTRAMUSCULAR | Status: DC | PRN

## 2017-03-17 MED ORDER — CEFAZOLIN SODIUM-DEXTROSE 2-4 GM/100ML-% IV SOLN
2.0000 g | Freq: Four times a day (QID) | INTRAVENOUS | Status: AC
  Administered 2017-03-17 – 2017-03-18 (×2): 2 g via INTRAVENOUS
  Filled 2017-03-17 (×2): qty 100

## 2017-03-17 MED ORDER — METHOCARBAMOL 500 MG PO TABS
500.0000 mg | ORAL_TABLET | Freq: Four times a day (QID) | ORAL | Status: DC | PRN
  Administered 2017-03-18: 500 mg via ORAL
  Filled 2017-03-17 (×4): qty 1

## 2017-03-17 MED ORDER — METHOCARBAMOL 1000 MG/10ML IJ SOLN
500.0000 mg | Freq: Four times a day (QID) | INTRAMUSCULAR | Status: DC | PRN
  Filled 2017-03-17: qty 5

## 2017-03-17 MED ORDER — BUPIVACAINE-EPINEPHRINE (PF) 0.5% -1:200000 IJ SOLN
INTRAMUSCULAR | Status: AC
Start: 2017-03-17 — End: 2017-03-17
  Filled 2017-03-17: qty 30

## 2017-03-17 MED ORDER — FENTANYL CITRATE (PF) 100 MCG/2ML IJ SOLN
INTRAMUSCULAR | Status: AC
  Administered 2017-03-17: 50 ug
  Filled 2017-03-17: qty 2

## 2017-03-17 MED ORDER — TRANEXAMIC ACID 1000 MG/10ML IV SOLN
1000.0000 mg | Freq: Once | INTRAVENOUS | Status: AC
  Administered 2017-03-17: 1000 mg via INTRAVENOUS
  Filled 2017-03-17: qty 10

## 2017-03-17 MED ORDER — ACETAMINOPHEN 650 MG RE SUPP: 650.0000 mg | Freq: Four times a day (QID) | RECTAL | Status: DC | PRN

## 2017-03-17 MED ORDER — HYDROMORPHONE HCL 1 MG/ML IJ SOLN
0.5000 mg | INTRAMUSCULAR | Status: DC | PRN
  Administered 2017-03-17: 0.5 mg via INTRAVENOUS
  Administered 2017-03-18: 1 mg via INTRAVENOUS
  Filled 2017-03-17: qty 1

## 2017-03-17 MED ORDER — ALUM & MAG HYDROXIDE-SIMETH 200-200-20 MG/5ML PO SUSP: 30.0000 mL | ORAL | Status: DC | PRN

## 2017-03-17 MED ORDER — HYDROMORPHONE HCL 1 MG/ML IJ SOLN
INTRAMUSCULAR | Status: AC
  Administered 2017-03-17: 0.5 mg via INTRAVENOUS
  Filled 2017-03-17: qty 0.5

## 2017-03-17 MED ORDER — SODIUM CHLORIDE 0.9 % IR SOLN
Status: DC | PRN
  Administered 2017-03-17: 3000 mL

## 2017-03-17 MED ORDER — FENTANYL CITRATE (PF) 100 MCG/2ML IJ SOLN
25.0000 ug | INTRAMUSCULAR | Status: DC | PRN
  Administered 2017-03-17: 25 ug via INTRAVENOUS

## 2017-03-17 MED ORDER — MENTHOL 3 MG MT LOZG: 1.0000 | LOZENGE | OROMUCOSAL | Status: DC | PRN

## 2017-03-17 MED ORDER — METOCLOPRAMIDE HCL 5 MG PO TABS: 5.0000 mg | ORAL_TABLET | Freq: Three times a day (TID) | ORAL | Status: DC | PRN

## 2017-03-17 MED ORDER — BUPIVACAINE LIPOSOME 1.3 % IJ SUSP
20.0000 mL | Freq: Once | INTRAMUSCULAR | Status: AC
  Administered 2017-03-17: 20 mL
  Filled 2017-03-17: qty 20

## 2017-03-17 MED ORDER — ASPIRIN EC 325 MG PO TBEC
325.0000 mg | DELAYED_RELEASE_TABLET | Freq: Two times a day (BID) | ORAL | Status: DC
  Administered 2017-03-18 – 2017-03-20 (×5): 325 mg via ORAL
  Filled 2017-03-17 (×6): qty 1

## 2017-03-17 MED ORDER — ACETAMINOPHEN 325 MG PO TABS
650.0000 mg | ORAL_TABLET | Freq: Four times a day (QID) | ORAL | Status: DC | PRN
  Administered 2017-03-18 – 2017-03-20 (×3): 650 mg via ORAL
  Filled 2017-03-17 (×3): qty 2

## 2017-03-17 MED ORDER — BISACODYL 5 MG PO TBEC: 5.0000 mg | DELAYED_RELEASE_TABLET | Freq: Every day | ORAL | Status: DC | PRN

## 2017-03-17 MED ORDER — ONDANSETRON HCL 4 MG/2ML IJ SOLN
4.0000 mg | Freq: Four times a day (QID) | INTRAMUSCULAR | Status: DC | PRN
Start: 2017-03-17 — End: 2017-03-20

## 2017-03-17 MED ORDER — FENTANYL CITRATE (PF) 100 MCG/2ML IJ SOLN
INTRAMUSCULAR | Status: AC
  Administered 2017-03-17: 25 ug via INTRAVENOUS
  Filled 2017-03-17: qty 2

## 2017-03-17 SURGICAL SUPPLY — 58 items
BAG DECANTER FOR FLEXI CONT (MISCELLANEOUS) ×3 IMPLANT
BANDAGE ACE 4X5 VEL STRL LF (GAUZE/BANDAGES/DRESSINGS) IMPLANT
BANDAGE ELASTIC 6 VELCRO ST LF (GAUZE/BANDAGES/DRESSINGS) ×3 IMPLANT
BANDAGE ESMARK 6X9 LF (GAUZE/BANDAGES/DRESSINGS) ×1 IMPLANT
BLADE SAGITTAL 25.0X1.19X90 (BLADE) ×2 IMPLANT
BLADE SAGITTAL 25.0X1.19X90MM (BLADE) ×1
BLADE SAW SGTL 13.0X1.19X90.0M (BLADE) IMPLANT
BNDG ELASTIC 6X10 VLCR STRL LF (GAUZE/BANDAGES/DRESSINGS) IMPLANT
BNDG ESMARK 6X9 LF (GAUZE/BANDAGES/DRESSINGS) ×3
BNDG GAUZE ELAST 4 BULKY (GAUZE/BANDAGES/DRESSINGS) IMPLANT
BOWL SMART MIX CTS (DISPOSABLE) ×3 IMPLANT
CAP KNEE TOTAL 3 SIGMA ×3 IMPLANT
CEMENT HV SMART SET (Cement) ×6 IMPLANT
CLOSURE WOUND 1/2 X4 (GAUZE/BANDAGES/DRESSINGS) ×1
COVER SURGICAL LIGHT HANDLE (MISCELLANEOUS) ×3 IMPLANT
CUFF TOURNIQUET SINGLE 34IN LL (TOURNIQUET CUFF) ×3 IMPLANT
CUFF TOURNIQUET SINGLE 44IN (TOURNIQUET CUFF) IMPLANT
DECANTER SPIKE VIAL GLASS SM (MISCELLANEOUS) ×3 IMPLANT
DRAPE EXTREMITY T 121X128X90 (DRAPE) ×3 IMPLANT
DRAPE HALF SHEET 40X57 (DRAPES) ×6 IMPLANT
DRAPE U-SHAPE 47X51 STRL (DRAPES) ×3 IMPLANT
DRESSING AQUACEL AG SP 3.5X10 (GAUZE/BANDAGES/DRESSINGS) ×1 IMPLANT
DRSG ADAPTIC 3X8 NADH LF (GAUZE/BANDAGES/DRESSINGS) IMPLANT
DRSG AQUACEL AG SP 3.5X10 (GAUZE/BANDAGES/DRESSINGS) ×3
DRSG PAD ABDOMINAL 8X10 ST (GAUZE/BANDAGES/DRESSINGS) ×3 IMPLANT
DURAPREP 26ML APPLICATOR (WOUND CARE) ×6 IMPLANT
ELECT REM PT RETURN 9FT ADLT (ELECTROSURGICAL) ×3
ELECTRODE REM PT RTRN 9FT ADLT (ELECTROSURGICAL) ×1 IMPLANT
GAUZE SPONGE 4X4 12PLY STRL (GAUZE/BANDAGES/DRESSINGS) ×3 IMPLANT
GLOVE BIO SURGEON STRL SZ8 (GLOVE) ×9 IMPLANT
GLOVE BIOGEL PI IND STRL 8 (GLOVE) ×3 IMPLANT
GLOVE BIOGEL PI INDICATOR 8 (GLOVE) ×6
GOWN STRL REUS W/ TWL LRG LVL3 (GOWN DISPOSABLE) ×3 IMPLANT
GOWN STRL REUS W/ TWL XL LVL3 (GOWN DISPOSABLE) ×3 IMPLANT
GOWN STRL REUS W/TWL LRG LVL3 (GOWN DISPOSABLE) ×6
GOWN STRL REUS W/TWL XL LVL3 (GOWN DISPOSABLE) ×6
HANDPIECE INTERPULSE COAX TIP (DISPOSABLE) ×2
HOOD PEEL AWAY FACE SHEILD DIS (HOOD) ×9 IMPLANT
IMMOBILIZER KNEE 22 UNIV (SOFTGOODS) ×3 IMPLANT
KIT BASIN OR (CUSTOM PROCEDURE TRAY) ×3 IMPLANT
KIT ROOM TURNOVER OR (KITS) ×3 IMPLANT
MANIFOLD NEPTUNE II (INSTRUMENTS) ×3 IMPLANT
NEEDLE HYPO 21X1 ECLIPSE (NEEDLE) ×3 IMPLANT
NS IRRIG 1000ML POUR BTL (IV SOLUTION) ×3 IMPLANT
PACK TOTAL JOINT (CUSTOM PROCEDURE TRAY) ×3 IMPLANT
PAD ARMBOARD 7.5X6 YLW CONV (MISCELLANEOUS) ×12 IMPLANT
SET HNDPC FAN SPRY TIP SCT (DISPOSABLE) ×1 IMPLANT
STRIP CLOSURE SKIN 1/2X4 (GAUZE/BANDAGES/DRESSINGS) ×2 IMPLANT
SUT VIC AB 0 CT1 27 (SUTURE) ×2
SUT VIC AB 0 CT1 27XBRD ANBCTR (SUTURE) ×1 IMPLANT
SUT VIC AB 2-0 CT1 27 (SUTURE) ×2
SUT VIC AB 2-0 CT1 TAPERPNT 27 (SUTURE) ×1 IMPLANT
SUT VIC AB 3-0 FS2 27 (SUTURE) ×3 IMPLANT
SUT VLOC 180 0 24IN GS25 (SUTURE) ×3 IMPLANT
SYR 50ML LL SCALE MARK (SYRINGE) ×3 IMPLANT
TOWEL OR 17X24 6PK STRL BLUE (TOWEL DISPOSABLE) ×3 IMPLANT
TOWEL OR 17X26 10 PK STRL BLUE (TOWEL DISPOSABLE) ×3 IMPLANT
TRAY CATH 16FR W/PLASTIC CATH (SET/KITS/TRAYS/PACK) IMPLANT

## 2017-03-17 NOTE — Interval H&P Note (Signed)
History and Physical Interval Note:  03/17/2017 9:32 AM  Gerald Lewis  has presented today for surgery, with the diagnosis of LEFT KNEE DEGENERATIVE JOINT DISEASE  The various methods of treatment have been discussed with the patient and family. After consideration of risks, benefits and other options for treatment, the patient has consented to  Procedure(s): TOTAL KNEE ARTHROPLASTY (Left) as a surgical intervention .  The patient's history has been reviewed, patient examined, no change in status, stable for surgery.  I have reviewed the patient's chart and labs.  Questions were answered to the patient's satisfaction.     Toby Ayad G

## 2017-03-17 NOTE — Progress Notes (Signed)
notifed dr ellander of pts hr dropping to 32 no rx ordered at this time will continue to monitor and observe

## 2017-03-17 NOTE — Progress Notes (Signed)
Orthopedic Tech Progress Note Patient Details:  Gerald HughsCarl E Lewis 18-Sep-1935 [id number]  CPM Left Knee CPM Left Knee: On Left Knee Flexion (Degrees): 90 Left Knee Extension (Degrees): 0 Additional Comments: foot roll   [name] 03/17/2017, 3:02 PM

## 2017-03-17 NOTE — Op Note (Signed)
PREOP DIAGNOSIS: DJD LEFT KNEE POSTOP DIAGNOSIS:  same PROCEDURE: LEFT TKR ANESTHESIA: Spinal and MAC ATTENDING SURGEON: Manasa Spease G ASSISTANTLoni Dolly PA  INDICATIONS FOR PROCEDURE: Gerald Lewis is a 82 y.o. male who has struggled for a long time with pain due to degenerative arthritis of the left knee.  The patient has failed many conservative non-operative measures and at this point has pain which limits the ability to sleep and walk.  The patient is offered total knee replacement.  Informed operative consent was obtained after discussion of possible risks of anesthesia, infection, neurovascular injury, DVT, and death.  The importance of the post-operative rehabilitation protocol to optimize result was stressed extensively with the patient.  SUMMARY OF FINDINGS AND PROCEDURE:  Gerald Lewis was taken to the operative suite where under the above anesthesia a left knee replacement was performed.  There were advanced degenerative changes and the bone quality was excellent.  We used the DePuyLCS system and placed size standard plus femur, 5 tibia, 38 mm all polyethylene patella, and a size 12.5 mm spacer.  Gerald Dolly PA-C assisted throughout and was invaluable to the completion of the case in that he helped retract and maintain exposure while I placed the components.  He also helped close thereby minimizing OR time.  The patient was admitted for appropriate post-op care to include perioperative antibiotics and mechanical and pharmacologic measures for DVT prophylaxis.  DESCRIPTION OF PROCEDURE:  Gerald Lewis was taken to the operative suite where the above anesthesia was applied.  The patient was positioned supine and prepped and draped in normal sterile fashion.  An appropriate time out was performed.  After the administration of kefzol pre-op antibiotic the leg was elevated and exsanguinated and a tourniquet inflated.  A standard longitudinal incision was made on the anterior knee.   Dissection was carried down to the extensor mechanism.  All appropriate anti-infective measures were used including the pre-operative antibiotic, betadine impregnated drape, and closed hooded exhaust systems for each member of the surgical team.  A medial parapatellar incision was made in the extensor mechanism and the knee cap flipped and the knee flexed.  Some residual meniscal tissues were removed along with any remaining ACL/PCL tissue.  A guide was placed on the tibia and a flat cut was made on it's superior surface.  An intramedullary guide was placed in the femur and was utilized to make anterior and posterior cuts creating an appropriate flexion gap.  A second intramedullary guide was placed in the femur to make a distal cut properly balancing the knee with an extension gap equal to the flexion gap.  The three bones sized to the above mentioned sizes and the appropriate guides were placed and utilized.  A trial reduction was done and the knee easily came to full extension and the patella tracked well on flexion.  The trial components were removed and all bones were cleaned with pulsatile lavage and then dried thoroughly.  Cement was mixed and was pressurized onto the bones followed by placement of the aforementioned components.  Excess cement was trimmed and pressure was held on the components until the cement had hardened.  The tourniquet was deflated and a small amount of bleeding was controlled with cautery and pressure.  The knee was irrigated thoroughly.  The extensor mechanism was re-approximated with V-loc suture in running fashion.  The knee was flexed and the repair was solid.  The subcutaneous tissues were re-approximated with #0 and #2-0 vicryl and the skin  closed with a subcuticular stitch and steristrips.  A sterile dressing was applied.  Intraoperative fluids, EBL, and tourniquet time can be obtained from anesthesia records.  DISPOSITION:  The patient was taken to recovery room in stable  condition and admitted for appropriate post-op care to include peri-operative antibiotic and DVT prophylaxis with mechanical and pharmacologic measures.  Gerald Lewis G 03/17/2017, 12:20 PM

## 2017-03-17 NOTE — Anesthesia Postprocedure Evaluation (Signed)
Anesthesia Post Note  Patient: Gerald Lewis  Procedure(s) Performed: Procedure(s) (LRB): TOTAL KNEE ARTHROPLASTY (Left)     Patient location during evaluation: PACU Anesthesia Type: Regional and Spinal Level of consciousness: awake and alert Pain management: pain level controlled Vital Signs Assessment: post-procedure vital signs reviewed and stable Respiratory status: spontaneous breathing, nonlabored ventilation, respiratory function stable and patient connected to nasal cannula oxygen Cardiovascular status: stable Postop Assessment: no signs of nausea or vomiting and spinal receding Anesthetic complications: no    Last Vitals:  Vitals:   03/17/17 1644 03/17/17 1645  BP: (!) 162/76 (!) 162/76  Pulse: 75 81  Resp: 18 18  Temp:  36.4 C    Last Pain:  Vitals:   03/17/17 0833  TempSrc: Oral                 Arseniy Toomey

## 2017-03-17 NOTE — Transfer of Care (Signed)
Immediate Anesthesia Transfer of Care Note  Patient: Gerald Lewis  Procedure(s) Performed: Procedure(s): TOTAL KNEE ARTHROPLASTY (Left)  Patient Location: PACU  Anesthesia Type:Spinal  Level of Consciousness: awake, alert  and oriented  Airway & Oxygen Therapy: Patient Spontanous Breathing  Post-op Assessment: Report given to RN and Post -op Vital signs reviewed and stable  Post vital signs: Reviewed and stable  Last Vitals:  Vitals:   03/17/17 0833 03/17/17 1304  BP: (!) 183/82 134/71  Pulse: 77 79  Resp: 18 17  Temp: 36.3 C 36.5 C    Last Pain:  Vitals:   03/17/17 0833  TempSrc: Oral         Complications: No apparent anesthesia complications

## 2017-03-17 NOTE — Anesthesia Procedure Notes (Signed)
Spinal  Patient location during procedure: OR Start time: 03/17/2017 10:40 AM End time: 03/17/2017 10:45 AM Staffing Anesthesiologist: Karna ChristmasELLENDER, RYAN P Performed: anesthesiologist  Preanesthetic Checklist Completed: patient identified, surgical consent, pre-op evaluation, timeout performed, IV checked, risks and benefits discussed and monitors and equipment checked Spinal Block Patient position: sitting Prep: DuraPrep Patient monitoring: cardiac monitor, continuous pulse ox and blood pressure Approach: midline Location: L3-4 Injection technique: single-shot Needle Needle type: Pencan  Needle gauge: 24 G Needle length: 9 cm Assessment Sensory level: T10 Additional Notes Functioning IV was confirmed and monitors were applied. Sterile prep and drape, including hand hygiene and sterile gloves were used. The patient was positioned and the spine was prepped. The skin was anesthetized with lidocaine.  Free flow of clear CSF was obtained prior to injecting local anesthetic into the CSF.  The spinal needle aspirated freely following injection.  The needle was carefully withdrawn.  The patient tolerated the procedure well.

## 2017-03-17 NOTE — Progress Notes (Addendum)
Patient due to void by 10pm. Pt unable to void. Bladder scanned patient-showed 903 ml. Paged oncall MD for order for I&O cath. I & O cath ordered  & completed - urine output.

## 2017-03-17 NOTE — Anesthesia Preprocedure Evaluation (Addendum)
Anesthesia Evaluation  Patient identified by MRN, date of birth, ID band Patient awake    Reviewed: Allergy & Precautions, NPO status , Patient's Chart, lab work & pertinent test results  Airway Mallampati: III  TM Distance: >3 FB Neck ROM: Full    Dental  (+) Poor Dentition, Edentulous Upper   Pulmonary former smoker,    Pulmonary exam normal breath sounds clear to auscultation       Cardiovascular hypertension, Pt. on medications Normal cardiovascular exam Rhythm:Regular Rate:Normal  ECG: NSR, rate 71   Neuro/Psych negative neurological ROS  negative psych ROS   GI/Hepatic negative GI ROS, Neg liver ROS,   Endo/Other  negative endocrine ROS  Renal/GU negative Renal ROS  negative genitourinary   Musculoskeletal  (+) Arthritis , Osteoarthritis,    Abdominal   Peds negative pediatric ROS (+)  Hematology  (+) anemia ,   Anesthesia Other Findings   Reproductive/Obstetrics negative OB ROS                            Anesthesia Physical Anesthesia Plan  ASA: II  Anesthesia Plan: Spinal and Regional   Post-op Pain Management:  Regional for Post-op pain   Induction: Intravenous  PONV Risk Score and Plan: 1 and Ondansetron and Dexamethasone  Airway Management Planned:   Additional Equipment:   Intra-op Plan:   Post-operative Plan:   Informed Consent: I have reviewed the patients History and Physical, chart, labs and discussed the procedure including the risks, benefits and alternatives for the proposed anesthesia with the patient or authorized representative who has indicated his/her understanding and acceptance.   Dental advisory given  Plan Discussed with: CRNA  Anesthesia Plan Comments:         Anesthesia Quick Evaluation

## 2017-03-17 NOTE — OR Nursing (Signed)
Dr. Maple HudsonMoser notified of pts inability to urinate and a bladder scan of >600.  Order received to I and O cath.  1000cc urine obtained with I and O cath.

## 2017-03-17 NOTE — Anesthesia Procedure Notes (Addendum)
Anesthesia Regional Block: Adductor canal block   Pre-Anesthetic Checklist: ,, timeout performed, Correct Patient, Correct Site, Correct Laterality, Correct Procedure,, site marked, risks and benefits discussed, Surgical consent,  Pre-op evaluation,  At surgeon's request and post-op pain management  Laterality: Left  Prep: chloraprep       Needles:  Injection technique: Single-shot  Needle Type: Echogenic Stimulator Needle     Needle Length: 9cm  Needle Gauge: 21     Additional Needles:   Procedures: ultrasound guided,,,,,,,,  Narrative:  Start time: 03/17/2017 10:15 AM End time: 03/17/2017 10:25 AM Injection made incrementally with aspirations every 5 mL.  Performed by: Personally  Anesthesiologist: Karna ChristmasELLENDER, RYAN P  Additional Notes: Functioning IV was confirmed and monitors were applied.  A 90mm 21ga Arrow echogenic stimulator needle was used. Sterile prep,hand hygiene and sterile gloves were used.  Negative aspiration and negative test dose prior to incremental administration of local anesthetic. The patient tolerated the procedure well.

## 2017-03-17 NOTE — Progress Notes (Signed)
[facility]  Patient Status: New  [facility] is providing the following services: PT  If patient discharges after hours, please call [phone].   Gerald EchevariaKaren Lewis 03/17/2017, 4:33 PM

## 2017-03-18 ENCOUNTER — Inpatient Hospital Stay (HOSPITAL_COMMUNITY): Payer: Medicare Other

## 2017-03-18 ENCOUNTER — Encounter (HOSPITAL_COMMUNITY): Payer: Self-pay | Admitting: Orthopaedic Surgery

## 2017-03-18 MED ORDER — METHOCARBAMOL 750 MG PO TABS
750.0000 mg | ORAL_TABLET | Freq: Four times a day (QID) | ORAL | Status: DC | PRN
  Administered 2017-03-19 – 2017-03-20 (×4): 750 mg via ORAL
  Filled 2017-03-18 (×4): qty 1

## 2017-03-18 MED ORDER — METHOCARBAMOL 1000 MG/10ML IJ SOLN
500.0000 mg | Freq: Four times a day (QID) | INTRAVENOUS | Status: DC | PRN
  Filled 2017-03-18: qty 5

## 2017-03-18 MED ORDER — TRAMADOL HCL 50 MG PO TABS
50.0000 mg | ORAL_TABLET | Freq: Four times a day (QID) | ORAL | Status: DC | PRN
  Administered 2017-03-18 – 2017-03-20 (×7): 50 mg via ORAL
  Filled 2017-03-18 (×7): qty 1

## 2017-03-18 NOTE — Clinical Social Work Note (Signed)
Clinical Social Work Assessment  Patient Details  Name: Gerald Lewis MRN: [id number] Date of Birth: 1934/10/26  Date of referral:  03/18/17               Reason for consult:  Facility Placement                Permission sought to share information with:  Facility Sport and exercise psychologist Permission granted to share information::  Yes, Verbal Permission Granted  Name::     Braxtin Bamba.  Agency::  SNF  Relationship::  son, daughter  Contact Information:     Housing/Transportation Living arrangements for the past 2 months:  Single Family Home Source of Information:  Patient Patient Interpreter Needed:  None Criminal Activity/Legal Involvement Pertinent to Current Situation/Hospitalization:  No - Comment as needed Significant Relationships:  Adult Children, Other Family Members Lives with:  Self Do you feel safe going back to the place where you live?  No Need for family participation in patient care:     Care giving concerns:  Patient resides at home alone.   His support system is a son who drives trucks and is on the road most of the day.  At this time, patient unsafe to return home.  Social Worker assessment / plan:  CSW met with patient at bedside to discuss clinical team's recommendation for DC to SNF.  CSW explained her role and SNF placement/options. Patient indicated that he wanted to go to [facility] and gave CSW permission send out offers. CSW called son and advised of SNF recommendation.  FL2 started/pending because patient has not had PT/OT evaluation yet. FPFBS#[id number]. Offers not sent yet.  Employment status:  Retired Nurse, adult PT Recommendations:  [place] / Referral to community resources:  [place]  Patient/Family's Response to care:  Patient appreciative of CSW assistance with SNF placement. No issues or concerns identified.  Patient/Family's Understanding of and Emotional Response to  Diagnosis, Current Treatment, and Prognosis:  Patient has good understanding of diagnosis, current treatment and prognosis. Patient hopeful that rehabilitation will address impairment. No issues or concerns identified at this time.  Emotional Assessment Appearance:  Appears stated age Attitude/Demeanor/Rapport:   (Cooperative) Affect (typically observed):    Orientation:  Oriented to Self, Oriented to Place, Oriented to  Time, Oriented to Situation Alcohol / Substance use:  Not Applicable Psych involvement (Current and /or in the community):  No (Comment)  Discharge Needs  Concerns to be addressed:  Care Coordination Readmission within the last 30 days:  No Current discharge risk:  Dependent with Mobility, Physical Impairment Barriers to Discharge:  No Barriers Identified   Normajean Baxter, LCSW 03/18/2017, 12:10 PM

## 2017-03-18 NOTE — Progress Notes (Signed)
PT Cancellation Note  Patient Details Name: Ardis HughsCarl E Streight MRN: [id number] DOB: 10-03-1935   Cancelled Treatment:    Reason Eval/Treat Not Completed: (P) Patient at procedure or test/unavailable Pt L knee xray for why knee will not go into extension.   Bruchy Mikel B. Beverely RisenVan Fleet PT, DPT Acute Rehabilitation  [phone] Pager [phone]   Elon Alaslizabeth B Van [facility] 03/18/2017, 9:43 AM

## 2017-03-18 NOTE — Progress Notes (Signed)
Bladder scanned patient - 0 ml urine shown.

## 2017-03-18 NOTE — Evaluation (Signed)
Occupational Therapy Evaluation Patient Details Name: Gerald HughsCarl E Lewis MRN: [id number] DOB: [birth year] Today's Date: 03/18/2017    History of Present Illness 82 yo male s/p L TKA. PMH including HTN.   Clinical Impression   PTA, pt was living alone and was independent with ADLs and expressed difficulty with IADLs. Currently, pt requires Max A for ADLs and Max A +2 for functional transfers. Pt would benefit from further acute OT to increase his occupational performance and facilitate safe dc. Recommend dc to SNF for further OT to increase his safety and independence with ADLs and functional mobility.      Follow Up Recommendations  SNF;Supervision/Assistance - 24 hour    Equipment Recommendations  Other (comment) (Defer to next venue)    Recommendations for Other Services PT consult     Precautions / Restrictions Precautions Precautions: Fall;Knee Precaution Comments: Educated pt on resting with leg extended. Pt unable to extend knee Restrictions Weight Bearing Restrictions: Yes LLE Weight Bearing: Weight bearing as tolerated      Mobility Bed Mobility Overal bed mobility: Needs Assistance Bed Mobility: Supine to Sit     Supine to sit: Max assist;HOB elevated     General bed mobility comments: Pt requires Max A to transition BLE from supine to EOB with use of pad. Pt needs A to push up trunk  Transfers Overall transfer level: Needs assistance Equipment used: Rolling walker (2 wheeled) Transfers: Sit to/from [employer] CorporationStand;Stand Pivot Transfers Sit to Stand: Max assist;+2 physical assistance;+2 safety/equipment Stand pivot transfers: Max assist;+2 physical assistance;+2 safety/equipment       General transfer comment: Pt requires Max A +2 to push up into standing and then transition towards recliner.  (RW used for sit<>stand. No DME for stand pivot)    Balance Overall balance assessment: Needs assistance Sitting-balance support: Feet supported;Bilateral upper extremity  supported;No upper extremity supported Sitting balance-Leahy Scale: Poor Sitting balance - Comments: Pt has post lean without UE support.  Postural control: Posterior lean Standing balance support: Bilateral upper extremity supported;During functional activity;No upper extremity supported Standing balance-Leahy Scale: Zero Standing balance comment: Pt requires Max A +2 and unable to maintain standing                           ADL either performed or assessed with clinical judgement   ADL Overall ADL's : Needs assistance/impaired Eating/Feeding: Set up;Sitting (In recliner. Poor balance at EOB)   Grooming: Set up;Bed level   Upper Body Bathing: Set up;Sitting   Lower Body Bathing: Maximal assistance;+2 for physical assistance;+2 for safety/equipment;Sit to/from stand   Upper Body Dressing : Minimal assistance;Sitting   Lower Body Dressing: Maximal assistance;+2 for physical assistance;+2 for safety/equipment;Sit to/from stand   Toilet Transfer: Maximal assistance;+2 for physical assistance;+2 for safety/equipment;Stand-pivot;RW (Simulated to recliner)             General ADL Comments: Pt with poor functional performance and mainly limited by pain in LLE which causes him to tense up and flex his L knee     Vision         Perception     Praxis      Pertinent Vitals/Pain Pain Assessment: Faces Faces Pain Scale: Hurts whole lot Pain Location: L knee Pain Descriptors / Indicators: Constant;Discomfort;Grimacing;Guarding;Operative site guarding;Sharp;Shooting Pain Intervention(s): Monitored during session;Limited activity within patient's tolerance;Repositioned;Relaxation     Hand Dominance Right   Extremity/Trunk Assessment Upper Extremity Assessment Upper Extremity Assessment: Generalized weakness   Lower Extremity Assessment Lower Extremity  Assessment: Defer to PT evaluation       Communication Communication Communication: HOH   Cognition  Arousal/Alertness: Lethargic;Suspect due to medications Behavior During Therapy: [facility] for tasks assessed/performed Overall Cognitive Status: Within Functional Limits for tasks assessed                                 General Comments: Pt awake but would close his eyes throughout session. Throughout session, pt would perseverate on topic and stories.    General Comments       Exercises     Shoulder Instructions      Home Living Family/patient expects to be discharged to:: Skilled nursing facility Living Arrangements: Alone                               Additional Comments: Lives in a "house trailer" with one bed/bath      Prior Functioning/Environment Level of Independence: Needs assistance  Gait / Transfers Assistance Needed: Pt uses a RW or "furnature surfing" ADL's / Homemaking Assistance Needed: Pt able to perform ADLs and simpel IADLs. Reports that he has difficulty maintaining his home stating "I live in a dump"            OT Problem List: Decreased strength;Decreased range of motion;Decreased activity tolerance;Impaired balance (sitting and/or standing);Decreased safety awareness;Decreased knowledge of precautions;Decreased knowledge of use of DME or AE;Pain      OT Treatment/Interventions: Self-care/ADL training;Therapeutic exercise;Energy conservation;DME and/or AE instruction;Therapeutic activities;Patient/family education    OT Goals(Current goals can be found in the care plan section) Acute Rehab OT Goals Patient Stated Goal: Stop pain OT Goal Formulation: With patient Time For Goal Achievement: 04/01/17 Potential to Achieve Goals: Good ADL Goals Pt Will Perform Grooming: with set-up;with supervision;sitting Pt Will Perform Upper Body Bathing: with set-up;with supervision;sitting Pt Will Perform Lower Body Bathing: with mod assist;sit to/from stand Pt Will Perform Upper Body Dressing: with set-up;with supervision;sitting Pt Will  Perform Lower Body Dressing: with mod assist;sit to/from stand Pt Will Transfer to Toilet: with mod assist;bedside commode;stand pivot transfer Pt Will Perform Toileting - Clothing Manipulation and hygiene: with mod assist;sit to/from stand  OT Frequency: Min 2X/week   Barriers to D/C: Decreased caregiver support  lives home alone       Co-evaluation              AM-PAC PT "6 Clicks" Daily Activity     Outcome Measure Help from another person eating meals?: None Help from another person taking care of personal grooming?: A Little Help from another person toileting, which includes using toliet, bedpan, or urinal?: A Lot Help from another person bathing (including washing, rinsing, drying)?: A Lot Help from another person to put on and taking off regular upper body clothing?: A Little Help from another person to put on and taking off regular lower body clothing?: A Lot 6 Click Score: 16   End of Session Equipment Utilized During Treatment: Gait belt;Rolling walker Nurse Communication: Mobility status;Weight bearing status  Activity Tolerance: Patient limited by pain Patient left: in chair;with call bell/phone within reach  OT Visit Diagnosis: Unsteadiness on feet (R26.81);Other abnormalities of gait and mobility (R26.89);Muscle weakness (generalized) (M62.81);Pain Pain - Right/Left: Left Pain - part of body: Knee                Time: 1354-1430 OT Time Calculation (min): 36 min Charges:  OT General Charges $OT Visit: 1 Procedure OT Evaluation $OT Eval Low Complexity: 1 Procedure G-Codes:     Kristol Almanzar MSOT, OTR/L Acute Rehab Pager: [phone] Office: [phone]  Theodoro Grist Vihana Kydd 03/18/2017, 2:43 PM

## 2017-03-18 NOTE — Progress Notes (Signed)
Subjective: 1 Day Post-Op Procedure(s) (LRB): TOTAL KNEE ARTHROPLASTY (Left)   Patient laying in bed with knee bent up about 75 degrees. He will not straighten it. He appears sedated but answers questions correctly just slowly.  Activity level:  wbat Diet tolerance:  ok Voiding:  In and out his morning Patient reports pain as moderate.    Objective: Vital signs in last 24 hours: Temp:  [97.4 F (36.3 C)-98.2 F (36.8 C)] 97.9 F (36.6 C) (06/13 0630) Pulse Rate:  [49-84] 76 (06/13 0630) Resp:  [14-23] 16 (06/13 0630) BP: (134-200)/(65-93) 165/69 (06/13 0630) SpO2:  [93 %-100 %] 98 % (06/13 0630) Weight:  [90.9 kg (200 lb 4.8 oz)] 90.9 kg (200 lb 4.8 oz) (06/12 0833)  Labs: No results for input(s): HGB in the last 72 hours. No results for input(s): WBC, RBC, HCT, PLT in the last 72 hours. No results for input(s): NA, K, CL, CO2, BUN, CREATININE, GLUCOSE, CALCIUM in the last 72 hours. No results for input(s): LABPT, INR in the last 72 hours.  Physical Exam:  Neurologically intact ABD soft Neurovascular intact Sensation intact distally Intact pulses distally Dorsiflexion/Plantar flexion intact Incision: dressing C/D/I and no drainage No cellulitis present Compartment soft  Assessment/Plan:  1 Day Post-Op Procedure(s) (LRB): TOTAL KNEE ARTHROPLASTY (Left) Advance diet Up with therapy  We will get X ray today to make sure he did not spin Poly or to see if there are any other abnormalities since he will not straighten his knee. Continue on ASA 325mg  BID 2 weeks post op. Follow up in office 2 weeks post op.   Naturi Alarid, Ginger OrganNDREW PAUL 03/18/2017, 7:48 AM

## 2017-03-18 NOTE — Evaluation (Signed)
Physical Therapy Evaluation Patient Details Name: Gerald Lewis MRN: [id number] DOB: [birth year] Today's Date: 03/18/2017   History of Present Illness  82 yo male s/p L TKA. PMH including HTN.  Clinical Impression  Pt is s/p TKA resulting in the deficits listed below (see PT Problem List). Pt currently, maxA for bed mobility and maxAx2 for transfers. Pt will benefit from skilled PT to increase their independence and safety with mobility to allow discharge to the venue listed below.      Follow Up Recommendations SNF    Equipment Recommendations  Other (comment) (to be determined at next venue)    Recommendations for Other Services OT consult     Precautions / Restrictions Precautions Precautions: Fall;Knee Precaution Comments: Educated pt on resting with leg extended. Pt unable to extend knee Restrictions Weight Bearing Restrictions: Yes LLE Weight Bearing: Weight bearing as tolerated      Mobility  Bed Mobility Overal bed mobility: Needs Assistance Bed Mobility: Supine to Sit     Supine to sit: Max assist;HOB elevated     General bed mobility comments: Pt requires Max A to transition BLE from supine to EOB with use of pad. Pt needs A to push up trunk  Transfers Overall transfer level: Needs assistance Equipment used: Rolling walker (2 wheeled) Transfers: Sit to/from [employer] Sit to Stand: Max assist;+2 physical assistance;+2 safety/equipment Stand pivot transfers: Max assist;+2 physical assistance;+2 safety/equipment       General transfer comment: Pt requires Max A +2 to push up into standing and then transition towards recliner.  (RW used for sit<>stand. No DME for stand pivot)      Balance Overall balance assessment: Needs assistance Sitting-balance support: Feet supported;Bilateral upper extremity supported;No upper extremity supported Sitting balance-Leahy Scale: Poor Sitting balance - Comments: Pt has post lean without UE support.   Postural control: Posterior lean Standing balance support: Bilateral upper extremity supported;During functional activity;No upper extremity supported Standing balance-Leahy Scale: Zero Standing balance comment: Pt requires Max A +2 and unable to maintain standing                             Pertinent Vitals/Pain Pain Assessment: Faces Faces Pain Scale: Hurts whole lot Pain Location: L knee Pain Descriptors / Indicators: Constant;Discomfort;Grimacing;Guarding;Operative site guarding;Sharp;Shooting Pain Intervention(s): Monitored during session;Limited activity within patient's tolerance;Repositioned;Patient requesting pain meds-RN notified  VSS    Home Living Family/patient expects to be discharged to:: Skilled nursing facility Living Arrangements: Alone               Additional Comments: Lives in a "house trailer" with one bed/bath    Prior Function Level of Independence: Needs assistance   Gait / Transfers Assistance Needed: Pt uses a RW or "furnature surfing"  ADL's / Homemaking Assistance Needed: Pt able to perform ADLs and simpel IADLs. Reports that he has difficulty maintaining his home stating "I live in a dump"        Hand Dominance   Dominant Hand: Right    Extremity/Trunk Assessment   Upper Extremity Assessment Upper Extremity Assessment: Generalized weakness    Lower Extremity Assessment Lower Extremity Assessment: RLE deficits/detail;LLE deficits/detail RLE Deficits / Details: R knee and hip ROM limited and strength grossly 3+/5 LLE Deficits / Details: L TKA, hip and knee strength and ROM limited by surgical pain, pt is unable to extend his knee past approximately 40 degrees without 10/10 pain LLE: Unable to fully assess due to pain  Communication   Communication: HOH  Cognition Arousal/Alertness: Lethargic;Suspect due to medications [facility] tasks assessed/performed Overall Cognitive Status: Within  Functional Limits for tasks assessed                                 General Comments: Pt awake but would close his eyes throughout session. Throughout session, pt would perseverate on topic and stories.       General Comments General comments (skin integrity, edema, etc.): Pt unable to extend L knee enough to don L knee immobilizer      Assessment/Plan    PT Assessment Patient needs continued PT services  PT Problem List Decreased strength;Decreased range of motion;Decreased activity tolerance;Decreased balance;Decreased mobility;Decreased knowledge of use of DME;Pain       PT Treatment Interventions DME instruction;Gait training;Functional mobility training;Therapeutic activities;Therapeutic exercise;Patient/family education    PT Goals (Current goals can be found in the Care Plan section)  Acute Rehab PT Goals Patient Stated Goal: Stop pain PT Goal Formulation: With patient Time For Goal Achievement: 04/01/17 Potential to Achieve Goals: Fair    Frequency 7X/week   Barriers to discharge Decreased caregiver support      Co-evaluation PT/OT/SLP Co-Evaluation/Treatment: Yes Reason for Co-Treatment: For patient/therapist safety;To address functional/ADL transfers PT goals addressed during session: Mobility/safety with mobility         AM-PAC PT "6 Clicks" Daily Activity  Outcome Measure Difficulty turning over in bed (including adjusting bedclothes, sheets and blankets)?: Total Difficulty moving from lying on back to sitting on the side of the bed? : Total Difficulty sitting down on and standing up from a chair with arms (e.g., wheelchair, bedside commode, etc,.)?: Total Help needed moving to and from a bed to chair (including a wheelchair)?: Total Help needed walking in hospital room?: Total Help needed climbing 3-5 steps with a railing? : Total 6 Click Score: 6    End of Session Equipment Utilized During Treatment: Gait belt Activity Tolerance:  Patient limited by pain Patient left: in chair;with call bell/phone within reach Nurse Communication: Mobility status;Precautions;Weight bearing status;Other (comment) (request for pt to stay up in recliner for at least 1 hr) PT Visit Diagnosis: Other abnormalities of gait and mobility (R26.89);Muscle weakness (generalized) (M62.81);Difficulty in walking, not elsewhere classified (R26.2);Pain Pain - Right/Left: Left Pain - part of body: Knee;Leg    Time: [id number] PT Time Calculation (min) (ACUTE ONLY): 20 min   Charges:   PT Evaluation $PT Eval Low Complexity: 1 Procedure     PT G Codes:        Ellorie Kindall B. Beverely RisenVan Fleet PT, DPT Acute Rehabilitation  [phone] Pager [phone]    Elon Alaslizabeth B Van Fleet 03/18/2017, 3:53 PM

## 2017-03-18 NOTE — Progress Notes (Signed)
Patient's knee is still bent at about 90 degrees.  PA Greig CastillaAndrew notified.  Patient started the day very drowsy. Patient did not receive any narcotics today. Patient is more alert.  PA changed pain medication for patient.

## 2017-03-18 NOTE — Progress Notes (Addendum)
Patient very drowsy and a little confused when trying to wake up after giving diluadid 1mg  IV at 318am. Patient fell immediately back to sleep even after ortho tech attempted to place CPM machine.

## 2017-03-18 NOTE — Progress Notes (Signed)
OT Cancellation   03/18/17 0900  OT Visit Information  Last OT Received On 03/18/17  Reason Eval/Treat Not Completed Patient at procedure or test/ unavailable (Pt off the floor for an x-ray. Will return as schedule allows.)   Helyn Schwan MSOT, OTR/L Acute Rehab Pager: [phone] Office: [phone]

## 2017-03-18 NOTE — Care Management Note (Signed)
Case Management Note  Patient Details  Name: Gerald Lewis MRN: [id number] Date of Birth: 1935/09/12  Subjective/Objective:       S/p L TKA              Action/Plan: Discharge Planning: Chart reviewed.  PT/OT recommended SNF. CSW following for SNF placement.   PCP Selina CooleyWITTEN, BOBBY MD  Expected Discharge Date:                 Expected Discharge Plan:  Skilled Nursing Facility  In-House Referral:  Clinical Social Work  Discharge planning Services  CM Consult  Post Acute Care Choice:  NA Choice offered to:  NA  DME Arranged:  N/A DME Agency:  NA  HH Arranged:  NA HH Agency:  NA  Status of Service:  Completed, signed off  If discussed at Long Length of Stay Meetings, dates discussed:    Additional Comments:  Gerald Lewis, Gerald Xia Ellen, RN 03/18/2017, 4:37 PM

## 2017-03-18 NOTE — Progress Notes (Signed)
Orthopedic Tech Progress Note Patient Details:  Gerald Lewis [birth year] [id number]  Patient ID: Gerald Lewis, male   DOB: [birth year], 82 y.o.   MRN: [id number] Attempted to put  pt. on cpm. I could not get the patients leg to straighten out to go in the cpm. As soon as I could get the leg to straighten a little he would recoil the leg and put it back into a 90 degree bend.  Trinna PostMartinez, Samona Chihuahua J 03/18/2017, 5:55 AM

## 2017-03-18 NOTE — OR Nursing (Signed)
Pt c/o needing to urinate.  From about 1500-1630 multiple attempts were made to assist Mr. Gerald Lewis to void including sitting up on the side of the bed.  His BP was elevated during this time due to straining to void and the discomfort related to positioning.  He was unable to void so I will contact [facility] for I and O cath order.  Mr. Gerald Lewis was not cathed during surgery even with order to do so.

## 2017-03-18 NOTE — Progress Notes (Signed)
Tried multiple attempts to straighten patient's knee while in the bone foam. Unable to do even after giving pain medicine to patient.

## 2017-03-19 NOTE — Progress Notes (Signed)
Subjective: 2 Days Post-Op Procedure(s) (LRB): TOTAL KNEE ARTHROPLASTY (Left)   Patient more alert this morning. He was still sitting with knee bent up to 90 degrees. After talking with him he straightened it more.     Activity level:  wbat Diet tolerance:  ok Voiding:  ok Patient reports pain as mild and moderate.    Objective: Vital signs in last 24 hours: Temp:  [97.6 F (36.4 C)-98.4 F (36.9 C)] 97.6 F (36.4 C) (06/14 0459) Pulse Rate:  [100] 100 (06/14 0459) Resp:  [18] 18 (06/14 0459) BP: (125-172)/(58-76) 125/62 (06/14 0459) SpO2:  [96 %] 96 % (06/14 0459)  Labs: No results for input(s): HGB in the last 72 hours. No results for input(s): WBC, RBC, HCT, PLT in the last 72 hours. No results for input(s): NA, K, CL, CO2, BUN, CREATININE, GLUCOSE, CALCIUM in the last 72 hours. No results for input(s): LABPT, INR in the last 72 hours.  Physical Exam:  Neurologically intact ABD soft Neurovascular intact Sensation intact distally Intact pulses distally Dorsiflexion/Plantar flexion intact Incision: dressing C/D/I and no drainage No cellulitis present Compartment soft  Assessment/Plan:  2 Days Post-Op Procedure(s) (LRB): TOTAL KNEE ARTHROPLASTY (Left) Advance diet Up with therapy Discharge to SNF maybe tomorrow if doing well and cleared by PT. HE will continue to try and relax leg. I got him within 10-15 degrees of straight this mornign once he relaxed.  Continue on ASA 325mg  BID x 2 weeks post op. Follow up in office 2 weeks post op.  Elpidio Thielen, Ginger OrganNDREW PAUL 03/19/2017, 8:47 AM

## 2017-03-19 NOTE — [place] FL2 (Signed)
[place] MEDICAID FL2 LEVEL OF CARE SCREENING TOOL     IDENTIFICATION  Patient Name: Gerald Lewis Birthdate: 1935/08/12 Sex: male Admission Date (Current Location): 03/17/2017  [facility] [place]   Facility and Address:  [facility] [place], [address], [place], [place] [address]      Provider Number: [id number]  Attending Physician Name and Address:  Marcene Corningalldorf, Peter, MD  Relative Name and Phone Number:  son,  Suzzette RighterCarl Jr., [phone]    Current Level of Care: Hospital Recommended Level of Care: Skilled Nursing Facility Prior Approval Number:    Date Approved/Denied: 03/18/17 PASRR Number: [id number]  Discharge Plan: SNF    Current Diagnoses: Patient Active Problem List   Diagnosis Date Noted  . Primary osteoarthritis of left knee 03/17/2017    Orientation RESPIRATION BLADDER Height & Weight     Self, Time, Situation, Place  O2 (Nasal Cannula 2L) Continent Weight: 200 lb 4.8 oz (90.9 kg) Height:  5\' 10"  (177.8 cm)  BEHAVIORAL SYMPTOMS/MOOD NEUROLOGICAL BOWEL NUTRITION STATUS      Continent Diet (See DC Summary)  AMBULATORY STATUS COMMUNICATION OF NEEDS Skin   Extensive Assist Verbally Surgical wounds (Closed Left Knee Incision with compression wrap)                       Personal Care Assistance Level of Assistance  Bathing, Feeding, Dressing Bathing Assistance: Maximum assistance Feeding assistance: Limited assistance Dressing Assistance: Maximum assistance     Functional Limitations Info  Sight, Hearing, Speech Sight Info: Adequate Hearing Info: Adequate Speech Info: Adequate    SPECIAL CARE FACTORS FREQUENCY  PT (By licensed PT), OT (By licensed OT)     PT Frequency: 5xweek OT Frequency: 5xweek            Contractures      Additional Factors Info  Code Status, Allergies Code Status Info: Full Allergies Info: KNA           Current Medications (03/19/2017):  This is the current hospital  active medication list Current Facility-Administered Medications  Medication Dose Route Frequency Provider Last Rate Last Dose  . acetaminophen (TYLENOL) tablet 650 mg  650 mg Oral Q6H PRN Elodia Florenceida, Andrew, PA-C   650 mg at 03/18/17 2039   Or  . acetaminophen (TYLENOL) suppository 650 mg  650 mg Rectal Q6H PRN Elodia FlorenceNida, Andrew, PA-C      . alum & mag hydroxide-simeth (MAALOX/MYLANTA) 200-200-20 MG/5ML suspension 30 mL  30 mL Oral Q4H PRN Elodia FlorenceNida, Andrew, PA-C      . aspirin EC tablet 325 mg  325 mg Oral BID PC Elodia Florenceida, Andrew, PA-C   325 mg at 03/18/17 1748  . bisacodyl (DULCOLAX) EC tablet 5 mg  5 mg Oral Daily PRN Elodia Florenceida, Andrew, PA-C      . diphenhydrAMINE (BENADRYL) 12.5 MG/5ML elixir 12.5-25 mg  12.5-25 mg Oral Q4H PRN Elodia FlorenceNida, Andrew, PA-C      . docusate sodium (COLACE) capsule 100 mg  100 mg Oral BID Elodia Florenceida, Andrew, PA-C   100 mg at 03/18/17 2039  . lactated ringers infusion   Intravenous Continuous Elodia Florenceida, Andrew, PA-C 50 mL/hr at 03/17/17 2013    . lisinopril (PRINIVIL,ZESTRIL) tablet 20 mg  20 mg Oral Daily Elodia Florenceida, Andrew, PA-C   20 mg at 03/18/17 0830  . menthol-cetylpyridinium (CEPACOL) lozenge 3 mg  1 lozenge Oral PRN Elodia FlorenceNida, Andrew, PA-C       Or  . phenol (CHLORASEPTIC) mouth spray 1 spray  1 spray Mouth/Throat PRN Elodia Florence, PA-C      . methocarbamol (ROBAXIN) tablet 750 mg  750 mg Oral Q6H PRN Elodia Florence, PA-C   750 mg at 03/19/17 1610   Or  . methocarbamol (ROBAXIN) 500 mg in dextrose 5 % 50 mL IVPB  500 mg Intravenous Q6H PRN Elodia Florence, PA-C      . metoCLOPramide (REGLAN) tablet 5-10 mg  5-10 mg Oral Q8H PRN Elodia Florence, PA-C       Or  . metoCLOPramide (REGLAN) injection 5-10 mg  5-10 mg Intravenous Q8H PRN Elodia Florence, PA-C      . ondansetron (ZOFRAN) tablet 4 mg  4 mg Oral Q6H PRN Elodia Florence, PA-C       Or  . ondansetron (ZOFRAN) injection 4 mg  4 mg Intravenous Q6H PRN Elodia Florence, PA-C      . traMADol Janean Sark) tablet 50 mg  50 mg Oral Q6H PRN Elodia Florence, PA-C   50 mg at 03/19/17  [id number]     Discharge Medications: Please see discharge summary for a list of discharge medications.  Relevant Imaging Results:  Relevant Lab Results:   Additional Information [id number]  Tresa Moore, LCSW

## 2017-03-19 NOTE — Progress Notes (Signed)
Orthopedic Tech Progress Note Patient Details:  Ardis HughsCarl E Hornberger 11-01-1934 [id number]  Patient ID: Ardis Hughsarl E Shifflet, male   DOB: 11-01-1934, 82 y.o.   MRN: [id number] Couldn't properly apply cpm. Pt could not straighten his leg  Trinna PostMartinez, Karson Reede J 03/19/2017, 6:14 AM

## 2017-03-19 NOTE — Progress Notes (Signed)
Physical Therapy Treatment Patient Details Name: Gerald Lewis MRN: [id number] DOB: [birth year] Today's Date: 03/19/2017    History of Present Illness 82 yo male s/p L TKA. PMH including HTN.    PT Comments    Pt continues to be limited by pain and inability to extend his L LE. Pt currently is modA for bed mobility and maxAx2 for transfers and ambulation of 2 feet with RW. Pt requires skilled PT to progress transfer and gait training as well as to improve LE ROM and strength to safely mobilize in his discharge environment.   Follow Up Recommendations  SNF     Equipment Recommendations  Other (comment) (to be determined at next venue)    Recommendations for Other Services OT consult     Precautions / Restrictions Precautions Precautions: Fall;Knee Precaution Comments: Educated pt on resting with leg extended. Pt unable to extend knee Restrictions Weight Bearing Restrictions: Yes LLE Weight Bearing: Weight bearing as tolerated    Mobility  Bed Mobility Overal bed mobility: Needs Assistance Bed Mobility: Supine to Sit     Supine to sit: HOB elevated;Mod assist     General bed mobility comments: modA for LE assist to floor and pad scoot of hips to EoB. vc for hand placement on bedrails   Transfers Overall transfer level: Needs assistance Equipment used: Rolling walker (2 wheeled) Transfers: Sit to/from [employer] Sit to Stand: +2 physical assistance;Mod assist         General transfer comment: maxAx2 for power up and steadying in upright vc for hand placement and reaching up to RW as well as need to extend bilateral knees to come to fully upright  Ambulation/Gait Ambulation/Gait assistance: Max assist;+2 physical assistance Ambulation Distance (Feet): 2 Feet Assistive device: Rolling walker (2 wheeled) Gait Pattern/deviations: Step-to pattern;Shuffle Gait velocity: slow Gait velocity interpretation: Below normal speed for age/gender General  Gait Details: max verbal cuing for movement of feet and extension of bilateral knees for upright posture       Balance Overall balance assessment: Needs assistance Sitting-balance support: Feet supported;Bilateral upper extremity supported;No upper extremity supported Sitting balance-Leahy Scale: Poor Sitting balance - Comments: Pt has post lean without UE support.  Postural control: Posterior lean Standing balance support: Bilateral upper extremity supported;During functional activity;No upper extremity supported Standing balance-Leahy Scale: Zero Standing balance comment: Pt requires Max A +2 and unable to maintain standing                            Cognition Arousal/Alertness: Lethargic;Suspect due to medications Behavior During Therapy: [facility] for tasks assessed/performed Overall Cognitive Status: Within Functional Limits for tasks assessed                                        Exercises Total Joint Exercises Ankle Circles/Pumps: AROM;Both;10 reps;Supine Quad Sets: AROM;5 reps;Left Goniometric ROM: 22 degrees of knee extension measured in supine     General Comments General comments (skin integrity, edema, etc.): Pt required maximal verbal cuing for relaxation and breath training to work on L knee extension. Pt would make progress and then it would get painful and he would flex his knee back and the process was repeated. Pt unable to extend L knee to  point where L knee immobilizer could be donned for transfers. Pt unable to full extend his R LE either despite cuing  in supine and in standing.       Pertinent Vitals/Pain Pain Assessment: 0-10 Pain Score: 8  Pain Location: L knee Pain Descriptors / Indicators: Constant;Discomfort;Grimacing;Guarding;Operative site guarding;Sharp;Shooting Pain Intervention(s): Premedicated before session;Monitored during session;Limited activity within patient's tolerance;Repositioned  Pt on 2 L O2 via nasal cannula at  entry with SaO2 >95%. Pt on RA with activity maintained SaO2 >93%O2  2 L O2 via nasal cannula returned after activity           PT Goals (current goals can now be found in the care plan section) Acute Rehab PT Goals Patient Stated Goal: Stop pain PT Goal Formulation: With patient Time For Goal Achievement: 04/01/17 Potential to Achieve Goals: Fair Progress towards PT goals: Progressing toward goals    Frequency    7X/week      PT Plan Current plan remains appropriate       AM-PAC PT "6 Clicks" Daily Activity  Outcome Measure  Difficulty turning over in bed (including adjusting bedclothes, sheets and blankets)?: Total Difficulty moving from lying on back to sitting on the side of the bed? : Total Difficulty sitting down on and standing up from a chair with arms (e.g., wheelchair, bedside commode, etc,.)?: Total Help needed moving to and from a bed to chair (including a wheelchair)?: Total Help needed walking in hospital room?: Total Help needed climbing 3-5 steps with a railing? : Total 6 Click Score: 6    End of Session Equipment Utilized During Treatment: Gait belt Activity Tolerance: Patient limited by pain Patient left: in chair;with call bell/phone within reach Nurse Communication: Mobility status;Precautions;Weight bearing status;Other (comment) (request for pt to stay up in recliner for at least 1 hr) PT Visit Diagnosis: Other abnormalities of gait and mobility (R26.89);Muscle weakness (generalized) (M62.81);Difficulty in walking, not elsewhere classified (R26.2);Pain Pain - Right/Left: Left Pain - part of body: Knee;Leg     Time: 1610-96041050-1151 PT Time Calculation (min) (ACUTE ONLY): 61 min  Charges:  $Therapeutic Exercise: 38-52 mins $Therapeutic Activity: 8-22 mins                    G Codes:       Gerald Lewis PT, DPT Acute Rehabilitation  [phone] Pager [phone]    Gerald Lewis 03/19/2017, 4:49 PM

## 2017-03-20 MED ORDER — TRAMADOL HCL 50 MG PO TABS: 50.0000 mg | ORAL_TABLET | Freq: Four times a day (QID) | ORAL | 0 refills | Status: DC | PRN

## 2017-03-20 MED ORDER — DOCUSATE SODIUM 100 MG PO CAPS: 100.0000 mg | ORAL_CAPSULE | Freq: Two times a day (BID) | ORAL | 0 refills | Status: AC

## 2017-03-20 MED ORDER — BISACODYL 5 MG PO TBEC: 5.0000 mg | DELAYED_RELEASE_TABLET | Freq: Every day | ORAL | 0 refills | Status: AC | PRN

## 2017-03-20 MED ORDER — METHOCARBAMOL 500 MG PO TABS: 500.0000 mg | ORAL_TABLET | Freq: Four times a day (QID) | ORAL | 0 refills | Status: AC | PRN

## 2017-03-20 MED ORDER — ASPIRIN 325 MG PO TBEC: 325.0000 mg | DELAYED_RELEASE_TABLET | Freq: Two times a day (BID) | ORAL | 0 refills | Status: DC

## 2017-03-20 NOTE — Progress Notes (Signed)
Occupational Therapy Treatment Patient Details Name: Gerald Lewis MRN: [id number] DOB: 1934/11/17 Today's Date: 03/20/2017    History of present illness 82 yo male s/p L TKA. PMH including HTN.   OT comments  Pt continues to demonstrate decreased functional performance due to flexion in L knee and significant pain. Pt performed toilet transfer with Max A +2 to stand pivot from EOB to [facility]. Continue to recommend dc to SNF for further OT to increase his safety and independence with ADLs. Answered all pt's questions in preparation for dc today.    Follow Up Recommendations  SNF;Supervision/Assistance - 24 hour    Equipment Recommendations  Other (comment) (Defer to next venue)    Recommendations for Other Services PT consult    Precautions / Restrictions Precautions Precautions: Fall;Knee Precaution Comments: Educated pt on resting with leg extended. Pt unable to extend knee Restrictions Weight Bearing Restrictions: Yes LLE Weight Bearing: Weight bearing as tolerated       Mobility Bed Mobility Overal bed mobility: Needs Assistance Bed Mobility: Supine to Sit     Supine to sit: HOB elevated;Mod assist     General bed mobility comments: modA for LE assist to floor and pad scoot of hips to EoB. vc for hand placement on bedrails   Transfers Overall transfer level: Needs assistance Equipment used: Rolling walker (2 wheeled) Transfers: Sit to/from [employer] Sit to Stand: +2 physical assistance;Mod assist Stand pivot transfers: Max assist;+2 physical assistance;+2 safety/equipment       General transfer comment: maxAx2 for power up and steadying in upright vc for hand placement and reaching up to RW as well as need to extend bilateral knees to come to fully upright    Balance Overall balance assessment: Needs assistance Sitting-balance support: Feet supported;Bilateral upper extremity supported;No upper extremity supported Sitting balance-Leahy Scale:  Poor Sitting balance - Comments: Pt has post lean without UE support.  Postural control: Posterior lean Standing balance support: Bilateral upper extremity supported;During functional activity;No upper extremity supported Standing balance-Leahy Scale: Zero Standing balance comment: Pt requires Max A +2 and unable to maintain standing                           ADL either performed or assessed with clinical judgement   ADL Overall ADL's : Needs assistance/impaired Eating/Feeding:  (In recliner. Poor balance at EOB)                       Toilet Transfer: Maximal assistance;+2 for physical assistance;+2 for safety/equipment;Stand-pivot;RW;BSC Toilet Transfer Details (indicate cue type and reason): Pt required Max A for stand pivot to BSC from EOB. Required Max VCs to initiate steps, but unable to put weight through LLE Toileting- Clothing Manipulation and Hygiene: Supervision/safety;Set up;Sitting/lateral lean Toileting - Clothing Manipulation Details (indicate cue type and reason): For toilet hygiene after urination. Pt would require physical A for BM hygiene     Functional mobility during ADLs: Maximal assistance;+2 for physical assistance;Rolling walker General ADL Comments: Pt with continued decreased fucntional performance.      Vision       Perception     Praxis      Cognition Arousal/Alertness: Lethargic;Suspect due to medications Behavior During Therapy: [facility] for tasks assessed/performed Overall Cognitive Status: Within Functional Limits for tasks assessed  General Comments: Pt awake but would close his eyes throughout session. Throughout session, pt would perseverate on topic and stories.         Exercises     Shoulder Instructions       General Comments      Pertinent Vitals/ Pain       Pain Assessment: Faces Faces Pain Scale: Hurts even more Pain Location: L knee Pain Descriptors / Indicators:  Constant;Discomfort;Grimacing;Guarding;Operative site guarding;Sharp;Shooting Pain Intervention(s): Monitored during session;Limited activity within patient's tolerance;Repositioned  Home Living                                          Prior Functioning/Environment              Frequency  Min 2X/week        Progress Toward Goals  OT Goals(current goals can now be found in the care plan section)  Progress towards OT goals: Progressing toward goals  Acute Rehab OT Goals Patient Stated Goal: Stop pain OT Goal Formulation: With patient Time For Goal Achievement: 04/01/17 Potential to Achieve Goals: Good ADL Goals Pt Will Perform Grooming: with set-up;with supervision;sitting Pt Will Perform Upper Body Bathing: with set-up;with supervision;sitting Pt Will Perform Lower Body Bathing: with mod assist;sit to/from stand Pt Will Perform Upper Body Dressing: with set-up;with supervision;sitting Pt Will Perform Lower Body Dressing: with mod assist;sit to/from stand Pt Will Transfer to Toilet: with mod assist;bedside commode;stand pivot transfer Pt Will Perform Toileting - Clothing Manipulation and hygiene: with mod assist;sit to/from stand  Plan Discharge plan remains appropriate    Co-evaluation    PT/OT/SLP Co-Evaluation/Treatment: Yes Reason for Co-Treatment: For patient/therapist safety PT goals addressed during session: Mobility/safety with mobility OT goals addressed during session: ADL's and self-care      AM-PAC PT "6 Clicks" Daily Activity     Outcome Measure   Help from another person eating meals?: None Help from another person taking care of personal grooming?: A Little Help from another person toileting, which includes using toliet, bedpan, or urinal?: A Lot Help from another person bathing (including washing, rinsing, drying)?: A Lot Help from another person to put on and taking off regular upper body clothing?: A Little Help from another  person to put on and taking off regular lower body clothing?: A Lot 6 Click Score: 16    End of Session Equipment Utilized During Treatment: Gait belt;Rolling walker CPM Left Knee CPM Left Knee: Off  OT Visit Diagnosis: Unsteadiness on feet (R26.81);Other abnormalities of gait and mobility (R26.89);Muscle weakness (generalized) (M62.81);Pain Pain - Right/Left: Left Pain - part of body: Knee   Activity Tolerance Patient limited by pain   Patient Left in chair;with call bell/phone within reach (with PT)   Nurse Communication Mobility status;Weight bearing status        Time: 1610-96041033-1054 OT Time Calculation (min): 21 min  Charges: OT General Charges $OT Visit: 1 Procedure OT Treatments $Self Care/Home Management : 8-22 mins  Taylon Coole MSOT, OTR/L Acute Rehab Pager: [phone] Office: [phone]   Theodoro GristCharis M Agustine Rossitto 03/20/2017, 11:01 AM

## 2017-03-20 NOTE — Progress Notes (Signed)
Physical Therapy Treatment Patient Details Name: Gerald Lewis MRN: [id number] DOB: December 11, 1934 Today's Date: 03/20/2017    History of Present Illness 82 yo male s/p L TKA. PMH including HTN.    PT Comments    Pt continues to be unable to extend L knee. Max A +2 required for transfers at pt is unable to come fully upright at the trunk or extend his LEs. Even with Max A +2 he was unable to pick up either foot to step. HEP performed with great difficulty to pt as he is very limited by pain. He should benefit from continued skilled PT to improve functional independence. Will continue to follow acutely.    Follow Up Recommendations  SNF     Equipment Recommendations  Other (comment) (to be determined at next venue)    Recommendations for Other Services OT consult     Precautions / Restrictions Precautions Precautions: Fall;Knee Precaution Comments: Educated pt on resting with leg extended. Pt unable to extend knee Restrictions Weight Bearing Restrictions: Yes LLE Weight Bearing: Weight bearing as tolerated    Mobility  Bed Mobility Overal bed mobility: Needs Assistance Bed Mobility: Supine to Sit     Supine to sit: HOB elevated;Mod assist     General bed mobility comments: modA for LE assist to floor and pad scoot of hips to EoB. vc for hand placement on bedrails   Transfers Overall transfer level: Needs assistance Equipment used: Rolling walker (2 wheeled) Transfers: Sit to/from [employer] Sit to Stand: +2 physical assistance;Mod assist Stand pivot transfers: Max assist;+2 physical assistance;+2 safety/equipment (x2)       General transfer comment: maxAx2 for power up and steadying in upright vc for hand placement and reaching up to RW. Pt unable to extend bil knees or come fully upright even with cueing.  Pivoted from bed to [facility] and from [facility] to recliner.  Ambulation/Gait             General Gait Details: Not attempted as pt was unable step  for transfers or come fully upright to safely ambulate.   Stairs            Wheelchair Mobility    Modified Rankin (Stroke Patients Only)       Balance Overall balance assessment: Needs assistance Sitting-balance support: Feet supported;Bilateral upper extremity supported;No upper extremity supported Sitting balance-Leahy Scale: Poor Sitting balance - Comments: Pt has post lean without UE support.  Postural control: Posterior lean Standing balance support: Bilateral upper extremity supported;During functional activity;No upper extremity supported Standing balance-Leahy Scale: Zero Standing balance comment: Pt requires Max A +2 and unable to maintain standing                            Cognition Arousal/Alertness: Lethargic;Suspect due to medications Behavior During Therapy: [facility] for tasks assessed/performed Overall Cognitive Status: Within Functional Limits for tasks assessed                                 General Comments: Pt awake but would close his eyes throughout session. Throughout session, pt would perseverate on topic and stories.       Exercises Total Joint Exercises Ankle Circles/Pumps: AROM;Both;10 reps;Supine Quad Sets: AROM;5 reps;Left (pt performed with great dificulty)    General Comments General comments (skin integrity, edema, etc.): Pt c/o pain at L ankle from knee immobalizer. Repositioned KI farther up  the leg and placed pillow case between KI and ankle. Also noted increased redness and warmth around surgical site RN notified.       Pertinent Vitals/Pain Pain Assessment: Faces Faces Pain Scale: Hurts even more Pain Location: L knee Pain Descriptors / Indicators: Constant;Discomfort;Grimacing;Guarding;Operative site guarding;Sharp;Shooting Pain Intervention(s): Limited activity within patient's tolerance;Monitored during session;Repositioned    Home Living                      Prior Function            PT  Goals (current goals can now be found in the care plan section) Acute Rehab PT Goals Patient Stated Goal: Stop pain PT Goal Formulation: With patient Time For Goal Achievement: 04/01/17 Potential to Achieve Goals: Fair Progress towards PT goals: Progressing toward goals    Frequency    7X/week      PT Plan Current plan remains appropriate    Co-evaluation PT/OT/SLP Co-Evaluation/Treatment: Yes Reason for Co-Treatment: For patient/therapist safety PT goals addressed during session: Mobility/safety with mobility;Strengthening/ROM OT goals addressed during session: ADL's and self-care      AM-PAC PT "6 Clicks" Daily Activity  Outcome Measure  Difficulty turning over in bed (including adjusting bedclothes, sheets and blankets)?: Total Difficulty moving from lying on back to sitting on the side of the bed? : Total Difficulty sitting down on and standing up from a chair with arms (e.g., wheelchair, bedside commode, etc,.)?: Total Help needed moving to and from a bed to chair (including a wheelchair)?: Total Help needed walking in hospital room?: Total Help needed climbing 3-5 steps with a railing? : Total 6 Click Score: 6    End of Session Equipment Utilized During Treatment: Gait belt Activity Tolerance: Patient limited by pain Patient left: in chair;with call bell/phone within reach Nurse Communication: Mobility status;Other (comment) (Increased redness and warmth at surgical site) PT Visit Diagnosis: Other abnormalities of gait and mobility (R26.89);Muscle weakness (generalized) (M62.81);Difficulty in walking, not elsewhere classified (R26.2);Pain Pain - Right/Left: Left Pain - part of body: Knee;Leg     Time: 1610-96041033-1107 PT Time Calculation (min) (ACUTE ONLY): 34 min  Charges:  $Therapeutic Activity: 8-22 mins                    G Codes:       Gerald LocksHannah Taylen Lewis, PTA Acute Rehab [phone]   Gerald ApleyHannah E Makeshia Lewis 03/20/2017, 12:51 PM

## 2017-03-20 NOTE — Progress Notes (Signed)
Subjective: 3 Days Post-Op Procedure(s) (LRB): TOTAL KNEE ARTHROPLASTY (Left)   Patient resting in bed comfortably eating breakfast. He has not had a BM yet but states that he does not need to go since he has not been eating much.   Activity level:  wbat Diet tolerance:  ok Voiding:  ok Patient reports pain as mild.    Objective: Vital signs in last 24 hours: Temp:  [97 F (36.1 C)-100 F (37.8 C)] 97.9 F (36.6 C) (06/15 0356) Pulse Rate:  [88-104] 98 (06/15 0356) Resp:  [16-20] 20 (06/15 0356) BP: (122-146)/(66-70) 127/66 (06/15 0356) SpO2:  [95 %-98 %] 95 % (06/15 0356)  Labs: No results for input(s): HGB in the last 72 hours. No results for input(s): WBC, RBC, HCT, PLT in the last 72 hours. No results for input(s): NA, K, CL, CO2, BUN, CREATININE, GLUCOSE, CALCIUM in the last 72 hours. No results for input(s): LABPT, INR in the last 72 hours.  Physical Exam:  Neurologically intact ABD soft Neurovascular intact Sensation intact distally Intact pulses distally Dorsiflexion/Plantar flexion intact Incision: dressing C/D/I and no drainage No cellulitis present Compartment soft  Assessment/Plan:  3 Days Post-Op Procedure(s) (LRB): TOTAL KNEE ARTHROPLASTY (Left) Advance diet Up with therapy Discharge to SNF today after PT. Continue current pain meds to limit confusion. Continue on ASA 325mg  BID x 2 weeks post op. Continue dulcolax and colace for constipation Follow up in office 2 weeks post op  Bhavin Monjaraz, Ginger OrganNDREW PAUL 03/20/2017, 8:13 AM

## 2017-03-20 NOTE — Social Work (Addendum)
Clinical Social Worker facilitated patient discharge including contacting patient family and facility to confirm patient discharge plans.   CSW unable to reach son, Suzzette RighterCarl Jr., and left voicemail message.  Clinical information faxed to facility and family agreeable with plan.  CS[facility]  RN to call [phone] report prior to discharge. [place] [facility] [place] [facility] [place] [address] [place]  Clinical Social Worker will sign off for now as social work intervention is no longer needed. Please consult us again if new need arises.  Keene BreathPatricia Shantika Bermea, LCSW Clinical Social Worker [phone]

## 2017-03-20 NOTE — Progress Notes (Signed)
Pt placed in bone foam and tolerated it well.

## 2017-03-20 NOTE — Progress Notes (Addendum)
Pt ready for d/c to SNF today per MD. Report was called to [place] [facility], all questions answered. Belongings including cellphone sent with pt. Waiting for transportation to facility via [facility]. SW contacted pt's son.   Davionte Lusby G    [facility] cannot transport pt's personal walker. Secretary Gwen called pt's son to have him pick it up.

## 2017-03-20 NOTE — Discharge Summary (Signed)
Patient ID: Gerald Lewis MRN: [id number] DOB/AGE: 12-09-1934 82 y.o.  Admit date: 03/17/2017 Discharge date: 03/20/2017  Admission Diagnoses:  Principal Problem:   Primary osteoarthritis of left knee   Discharge Diagnoses:  Same  Past Medical History:  Diagnosis Date  . Cataract    B/L  . DJD (degenerative joint disease)   . Hypertension   . Pneumonia   . Wears glasses     Surgeries: Procedure(s): TOTAL KNEE ARTHROPLASTY on 03/17/2017   Consultants:   Discharged Condition: Improved  Hospital Course: Gerald Lewis is an 82 y.o. male who was admitted 03/17/2017 for operative treatment ofPrimary osteoarthritis of left knee. Patient has severe unremitting pain that affects sleep, daily activities, and work/hobbies. After pre-op clearance the patient was taken to the operating room on 03/17/2017 and underwent  Procedure(s): TOTAL KNEE ARTHROPLASTY.    Patient was given perioperative antibiotics: Anti-infectives    Start     Dose/Rate Route Frequency Ordered Stop   03/17/17 1800  ceFAZolin (ANCEF) IVPB 2g/100 mL premix     2 g 200 mL/hr over 30 Minutes Intravenous Every 6 hours 03/17/17 1714 03/18/17 0030   03/17/17 0827  ceFAZolin (ANCEF) IVPB 2g/100 mL premix     2 g 200 mL/hr over 30 Minutes Intravenous On call to O.R. 03/17/17 0827 03/17/17 1055       Patient was given sequential compression devices, early ambulation, and chemoprophylaxis to prevent DVT.  Patient benefited maximally from hospital stay and there were no complications.    Recent vital signs: Patient Vitals for the past 24 hrs:  BP Temp Temp src Pulse Resp SpO2  03/20/17 0356 127/66 97.9 F (36.6 C) Oral 98 20 95 %  03/19/17 2057 (!) 146/69 100 F (37.8 C) Oral (!) 104 20 95 %  03/19/17 1508 122/70 97 F (36.1 C) Oral 88 16 98 %     Recent laboratory studies: No results for input(s): WBC, HGB, HCT, PLT, NA, K, CL, CO2, BUN, CREATININE, GLUCOSE, INR, CALCIUM in the last 72 hours.  Invalid  input(s): PT, 2   Discharge Medications:   Allergies as of 03/20/2017      Reactions   No Known Allergies       Medication List    TAKE these medications   ASPERCREME LIDOCAINE EX Apply 1 application topically daily as needed (pains).   aspirin 325 MG EC tablet Take 1 tablet (325 mg total) by mouth 2 (two) times daily after a meal. What changed:  medication strength  how much to take  when to take this   bisacodyl 5 MG EC tablet Commonly known as:  DULCOLAX Take 1 tablet (5 mg total) by mouth daily as needed for moderate constipation.   cholecalciferol 1000 units tablet Commonly known as:  VITAMIN D Take 1,000 Units by mouth daily.   docusate sodium 100 MG capsule Commonly known as:  COLACE Take 1 capsule (100 mg total) by mouth 2 (two) times daily.   Fish Oil 1000 MG Caps Take 1 capsule by mouth daily.   Ginkgo Biloba 40 MG Tabs Take 1 tablet by mouth daily.   lisinopril 20 MG tablet Commonly known as:  PRINIVIL,ZESTRIL Take 20 mg by mouth daily.   methocarbamol 500 MG tablet Commonly known as:  ROBAXIN Take 1 tablet (500 mg total) by mouth every 6 (six) hours as needed for muscle spasms.   multivitamin with minerals tablet Take 1 tablet by mouth daily.   MUSCLE RUB 10-15 % Crea Apply 1  application topically daily as needed for muscle pain.   saw palmetto 160 MG capsule Take 160 mg by mouth daily.   traMADol 50 MG tablet Commonly known as:  ULTRAM Take 1 tablet (50 mg total) by mouth every 6 (six) hours as needed for moderate pain or severe pain.   Turmeric 500 MG Caps Take 1 capsule by mouth daily.   vitamin C 500 MG tablet Commonly known as:  ASCORBIC ACID Take 1,000 mg by mouth daily.            Durable Medical Equipment        Start     Ordered   03/17/17 1715  DME Walker rolling  Once    Question:  Patient needs a walker to treat with the following condition  Answer:  Primary osteoarthritis of left knee   03/17/17 1714    03/17/17 1715  DME 3 n 1  Once     03/17/17 1714   03/17/17 1715  DME Bedside commode  Once    Question:  Patient needs a bedside commode to treat with the following condition  Answer:  Primary osteoarthritis of left knee   03/17/17 1714      Diagnostic Studies: Dg Chest 2 View  Result Date: 03/11/2017 CLINICAL DATA:  New preoperative exam.  Knee arthroplasty . EXAM: CHEST  2 VIEW COMPARISON:  01/31/2016 . FINDINGS: Mediastinum and hilar structures normal. Cardiomegaly with normal pulmonary vascularity. Calcified nodules noted consistent with granulomas. Low lung volumes with mild basilar atelectasis. No pleural effusion or pneumothorax. Degenerative changes thoracic spine. IMPRESSION: 1. Low lung volumes with mild basilar atelectasis. 2. Cardiomegaly.  No pulmonary venous congestion. Electronically Signed   By: Maisie Fushomas  Register   On: 03/11/2017 14:43   Dg Knee 1-2 Views Left  Result Date: 03/18/2017 CLINICAL DATA:  Postop left knee pain. EXAM: LEFT KNEE - 1-2 VIEW COMPARISON:  10/27/2016 FINDINGS: Total knee arthroplasty yesterday with no periprosthetic fracture or subluxation. The prosthesis appears well seated. Expected soft tissue gas and swelling. IMPRESSION: Recent total knee arthroplasty.  No unexpected finding. Electronically Signed   By: Marnee SpringJonathon  Watts M.D.   On: 03/18/2017 09:41    Disposition: 01-Home or Self Care  Discharge Instructions    Call MD / Call 911    Complete by:  As directed    If you experience chest pain or shortness of breath, CALL 911 and be transported to the hospital emergency room.  If you develope a fever above 101 F, pus (white drainage) or increased drainage or redness at the wound, or calf pain, call your surgeon's office.   Constipation Prevention    Complete by:  As directed    Drink plenty of fluids.  Prune juice may be helpful.  You may use a stool softener, such as Colace (over the counter) 100 mg twice a day.  Use MiraLax (over the counter) for  constipation as needed.   Diet - low sodium heart healthy    Complete by:  As directed    Discharge instructions    Complete by:  As directed    INSTRUCTIONS AFTER JOINT REPLACEMENT   Remove items at home which could result in a fall. This includes throw rugs or furniture in walking pathways ICE to the affected joint every three hours while awake for 30 minutes at a time, for at least the first 3-5 days, and then as needed for pain and swelling.  Continue to use ice for pain and swelling. You may  notice swelling that will progress down to the foot and ankle.  This is normal after surgery.  Elevate your leg when you are not up walking on it.   Continue to use the breathing machine you got in the hospital (incentive spirometer) which will help keep your temperature down.  It is common for your temperature to cycle up and down following surgery, especially at night when you are not up moving around and exerting yourself.  The breathing machine keeps your lungs expanded and your temperature down.   DIET:  As you were doing prior to hospitalization, we recommend a well-balanced diet.  DRESSING / WOUND CARE / SHOWERING  You may shower 3 days after surgery, but keep the wounds dry during showering.  You may use an occlusive plastic wrap (Press'n Seal for example), NO SOAKING/SUBMERGING IN THE BATHTUB.  If the bandage gets wet, change with a clean dry gauze.  If the incision gets wet, pat the wound dry with a clean towel.  ACTIVITY  Increase activity slowly as tolerated, but follow the weight bearing instructions below.   No driving for 6 weeks or until further direction given by your physician.  You cannot drive while taking narcotics.  No lifting or carrying greater than 10 lbs. until further directed by your surgeon. Avoid periods of inactivity such as sitting longer than an hour when not asleep. This helps prevent blood clots.  You may return to work once you are authorized by your doctor.      WEIGHT BEARING   Weight bearing as tolerated with assist device (walker, cane, etc) as directed, use it as long as suggested by your surgeon or therapist, typically at least 4-6 weeks.   EXERCISES  Results after joint replacement surgery are often greatly improved when you follow the exercise, range of motion and muscle strengthening exercises prescribed by your doctor. Safety measures are also important to protect the joint from further injury. Any time any of these exercises cause you to have increased pain or swelling, decrease what you are doing until you are comfortable again and then slowly increase them. If you have problems or questions, call your caregiver or physical therapist for advice.   Rehabilitation is important following a joint replacement. After just a few days of immobilization, the muscles of the leg can become weakened and shrink (atrophy).  These exercises are designed to build up the tone and strength of the thigh and leg muscles and to improve motion. Often times heat used for twenty to thirty minutes before working out will loosen up your tissues and help with improving the range of motion but do not use heat for the first two weeks following surgery (sometimes heat can increase post-operative swelling).   These exercises can be done on a training (exercise) mat, on the floor, on a table or on a bed. Use whatever works the best and is most comfortable for you.    Use music or television while you are exercising so that the exercises are a pleasant break in your day. This will make your life better with the exercises acting as a break in your routine that you can look forward to.   Perform all exercises about fifteen times, three times per day or as directed.  You should exercise both the operative leg and the other leg as well.   Exercises include:   Quad Sets - Tighten up the muscle on the front of the thigh (Quad) and hold for 5-10 seconds.  Straight Leg Raises -  With your knee straight (if you were given a brace, keep it on), lift the leg to 60 degrees, hold for 3 seconds, and slowly lower the leg.  Perform this exercise against resistance later as your leg gets stronger.  Leg Slides: Lying on your back, slowly slide your foot toward your buttocks, bending your knee up off the floor (only go as far as is comfortable). Then slowly slide your foot back down until your leg is flat on the floor again.  Angel Wings: Lying on your back spread your legs to the side as far apart as you can without causing discomfort.  Hamstring Strength:  Lying on your back, push your heel against the floor with your leg straight by tightening up the muscles of your buttocks.  Repeat, but this time bend your knee to a comfortable angle, and push your heel against the floor.  You may put a pillow under the heel to make it more comfortable if necessary.   A rehabilitation program following joint replacement surgery can speed recovery and prevent re-injury in the future due to weakened muscles. Contact your doctor or a physical therapist for more information on knee rehabilitation.    CONSTIPATION  Constipation is defined medically as fewer than three stools per week and severe constipation as less than one stool per week.  Even if you have a regular bowel pattern at home, your normal regimen is likely to be disrupted due to multiple reasons following surgery.  Combination of anesthesia, postoperative narcotics, change in appetite and fluid intake all can affect your bowels.   YOU MUST use at least one of the following options; they are listed in order of increasing strength to get the job done.  They are all available over the counter, and you may need to use some, POSSIBLY even all of these options:    Drink plenty of fluids (prune juice may be helpful) and high fiber foods Colace 100 mg by mouth twice a day  Senokot for constipation as directed and as needed Dulcolax (bisacodyl),  take with full glass of water  Miralax (polyethylene glycol) once or twice a day as needed.  If you have tried all these things and are unable to have a bowel movement in the first 3-4 days after surgery call either your surgeon or your primary doctor.    If you experience loose stools or diarrhea, hold the medications until you stool forms back up.  If your symptoms do not get better within 1 week or if they get worse, check with your doctor.  If you experience "the worst abdominal pain ever" or develop nausea or vomiting, please contact the office immediately for further recommendations for treatment.   ITCHING:  If you experience itching with your medications, try taking only a single pain pill, or even half a pain pill at a time.  You can also use Benadryl over the counter for itching or also to help with sleep.   TED HOSE STOCKINGS:  Use stockings on both legs until for at least 2 weeks or as directed by physician office. They may be removed at night for sleeping.  MEDICATIONS:  See your medication summary on the "After Visit Summary" that nursing will review with you.  You may have some home medications which will be placed on hold until you complete the course of blood thinner medication.  It is important for you to complete the blood thinner medication as prescribed.  PRECAUTIONS:  If you experience chest pain or shortness of breath - call 911 immediately for transfer to the hospital emergency department.   If you develop a fever greater that 101 F, purulent drainage from wound, increased redness or drainage from wound, foul odor from the wound/dressing, or calf pain - CONTACT YOUR SURGEON.                                                   FOLLOW-UP APPOINTMENTS:  If you do not already have a post-op appointment, please call the office for an appointment to be seen by your surgeon.  Guidelines for how soon to be seen are listed in your "After Visit Summary", but are typically between 1-4  weeks after surgery.  OTHER INSTRUCTIONS:   Knee Replacement:  Do not place pillow under knee, focus on keeping the knee straight while resting. CPM instructions: 0-90 degrees, 2 hours in the morning, 2 hours in the afternoon, and 2 hours in the evening. Place foam block, curve side up under heel at all times except when in CPM or when walking.  DO NOT modify, tear, cut, or change the foam block in any way.  MAKE SURE YOU:  Understand these instructions.  Get help right away if you are not doing well or get worse.    Thank you for letting us be a part of your medical care team.  It is a privilege we respect greatly.  We hope these instructions will help you stay on track for a fast and full recovery!   Increase activity slowly as tolerated    Complete by:  As directed        Contact information for follow-up providers    Marcene Corning, MD. Schedule an appointment as soon as possible for a visit in 2 weeks.   Specialty:  Orthopedic Surgery Contact information: [address] [place] [address] [phone]            Contact information for after-discharge care    Destination    [facility] .   Specialty:  Skilled Nursing Facility Contact information: [address] [place] [address] [phone]                   Signed: Drema Halon 03/20/2017, 8:18 AM

## 2017-03-20 NOTE — Clinical Social Work Placement (Signed)
   CLINICAL SOCIAL WORK PLACEMENT  NOTE  Date:  03/20/2017  Patient Details  Name: Gerald Lewis MRN: [id number] Date of Birth: October 16, 1934  Clinical Social Work is seeking post-discharge placement for this patient at the Skilled  Nursing Facility level of care (*CSW will initial, date and re-position this form in  chart as items are completed):  Yes   Patient/family provided with [facility] Clinical Social Work Department's list of facilities offering this level of care within the geographic area requested by the patient (or if unable, by the patient's family).  Yes   Patient/family informed of their freedom to choose among providers that offer the needed level of care, that participate in Medicare, Medicaid or managed care program needed by the patient, have an available bed and are willing to accept the patient.  Yes   Patient/family informed of [facility]'s ownership interest in [facility] and [facility], as well as of the fact that they are under no obligation to receive care at these facilities.  PASRR submitted to EDS on       PASRR number received on 03/18/17     Existing PASRR number confirmed on 03/18/17     FL2 transmitted to all facilities in geographic area requested by pt/family on 03/18/17     FL2 transmitted to all facilities within larger geographic area on 03/18/17     Patient informed that his/her managed care company has contracts with or will negotiate with certain facilities, including the following:        Yes   Patient/family informed of bed offers received.  [facility]     Physician recommends and patient chooses bed at      [facility].  Patient to be transferred to facility by [facility]     Patient family notified on 03/20/17 of transfer.  Name of family member notified:  son, Suzzette RighterCarl Jr.     PHYSICIAN Please prepare priority discharge summary, including medications, Please  prepare prescriptions, Please sign FL2     Additional Comment:    _______________________________________________ Tresa MoorePatricia V Jennalynn Rivard, LCSW 03/20/2017, 9:36 AM

## 2017-03-22 ENCOUNTER — Inpatient Hospital Stay (HOSPITAL_COMMUNITY)
Admission: EM | Admit: 2017-03-22 | Discharge: 2017-03-27 | DRG: 467 | Disposition: A | Discharge status: Skilled Nursing Facility | Payer: Medicare Other | Attending: Orthopaedic Surgery | Admitting: Orthopaedic Surgery

## 2017-03-22 ENCOUNTER — Emergency Department (HOSPITAL_COMMUNITY): Payer: Medicare Other

## 2017-03-22 ENCOUNTER — Emergency Department (HOSPITAL_BASED_OUTPATIENT_CLINIC_OR_DEPARTMENT_OTHER)
Admit: 2017-03-22 | Discharge: 2017-03-22 | Disposition: A | Discharge status: Home / Self Care | Payer: Medicare Other | Attending: Emergency Medicine | Admitting: Emergency Medicine

## 2017-03-22 ENCOUNTER — Encounter (HOSPITAL_COMMUNITY): Payer: Self-pay | Admitting: Emergency Medicine

## 2017-03-22 DIAGNOSIS — T84023A Instability of internal left knee prosthesis, initial encounter: Principal | ICD-10-CM | POA: Diagnosis present

## 2017-03-22 DIAGNOSIS — I10 Essential (primary) hypertension: Secondary | ICD-10-CM | POA: Diagnosis present

## 2017-03-22 DIAGNOSIS — T84038A Mechanical loosening of other internal prosthetic joint, initial encounter: Secondary | ICD-10-CM | POA: Diagnosis present

## 2017-03-22 DIAGNOSIS — Z419 Encounter for procedure for purposes other than remedying health state, unspecified: Secondary | ICD-10-CM

## 2017-03-22 DIAGNOSIS — M79606 Pain in leg, unspecified: Secondary | ICD-10-CM

## 2017-03-22 DIAGNOSIS — D62 Acute posthemorrhagic anemia: Secondary | ICD-10-CM | POA: Diagnosis not present

## 2017-03-22 DIAGNOSIS — Z87891 Personal history of nicotine dependence: Secondary | ICD-10-CM

## 2017-03-22 DIAGNOSIS — M25562 Pain in left knee: Secondary | ICD-10-CM | POA: Diagnosis not present

## 2017-03-22 DIAGNOSIS — Z7982 Long term (current) use of aspirin: Secondary | ICD-10-CM

## 2017-03-22 DIAGNOSIS — R339 Retention of urine, unspecified: Secondary | ICD-10-CM | POA: Diagnosis present

## 2017-03-22 DIAGNOSIS — Z96659 Presence of unspecified artificial knee joint: Secondary | ICD-10-CM

## 2017-03-22 DIAGNOSIS — Y792 Prosthetic and other implants, materials and accessory orthopedic devices associated with adverse incidents: Secondary | ICD-10-CM | POA: Diagnosis present

## 2017-03-22 LAB — BASIC METABOLIC PANEL
ANION GAP: 10 (ref 5–15)
BUN: 27 mg/dL — AB (ref 6–20)
CALCIUM: 8.2 mg/dL — AB (ref 8.9–10.3)
CO2: 24 mmol/L (ref 22–32)
Chloride: 102 mmol/L (ref 101–111)
Creatinine, Ser: 1.11 mg/dL (ref 0.61–1.24)
GFR calc Af Amer: >60 mL/min (ref >60)
GLUCOSE: 92 mg/dL (ref 65–99)
POTASSIUM: 3.7 mmol/L (ref 3.5–5.1)
SODIUM: 136 mmol/L (ref 135–145)

## 2017-03-22 LAB — URINALYSIS, ROUTINE W REFLEX MICROSCOPIC
BILIRUBIN URINE: NEGATIVE
GLUCOSE, UA: NEGATIVE mg/dL
Ketones, ur: NEGATIVE mg/dL
Leukocytes, UA: NEGATIVE
Nitrite: NEGATIVE
PROTEIN: NEGATIVE mg/dL
Specific Gravity, Urine: <1.005 — ABNORMAL LOW (ref 1.005–1.030)
pH: 6 (ref 5.0–8.0)

## 2017-03-22 LAB — SYNOVIAL CELL COUNT + DIFF, W/ CRYSTALS
Crystals, Fluid: NONE SEEN
Lymphocytes-Synovial Fld: 2 % (ref 0–20)
MONOCYTE-MACROPHAGE-SYNOVIAL FLUID: 1 % — AB (ref 50–90)
Neutrophil, Synovial: 97 % — ABNORMAL HIGH (ref 0–25)
WBC, Synovial: 2600 /mm3 — ABNORMAL HIGH (ref 0–200)

## 2017-03-22 LAB — CBC WITH DIFFERENTIAL/PLATELET
BASOS ABS: 0 10*3/uL (ref 0.0–0.1)
Basophils Relative: 0 %
EOS ABS: 0.1 10*3/uL (ref 0.0–0.7)
EOS PCT: 1 %
HCT: 28.5 % — ABNORMAL LOW (ref 39.0–52.0)
Hemoglobin: 9.7 g/dL — ABNORMAL LOW (ref 13.0–17.0)
Lymphocytes Relative: 6 %
Lymphs Abs: 1 10*3/uL (ref 0.7–4.0)
MCH: 27.3 pg (ref 26.0–34.0)
MCHC: 34 g/dL (ref 30.0–36.0)
MCV: 80.3 fL (ref 78.0–100.0)
MONO ABS: 1 10*3/uL (ref 0.1–1.0)
Monocytes Relative: 6 %
Neutro Abs: 14.8 10*3/uL — ABNORMAL HIGH (ref 1.7–7.7)
Neutrophils Relative %: 87 %
PLATELETS: 440 10*3/uL — AB (ref 150–400)
RBC: 3.55 MIL/uL — AB (ref 4.22–5.81)
RDW: 15.4 % (ref 11.5–15.5)
WBC: 17.1 10*3/uL — AB (ref 4.0–10.5)

## 2017-03-22 LAB — URINALYSIS, MICROSCOPIC (REFLEX)
Bacteria, UA: NONE SEEN
RBC / HPF: NONE SEEN RBC/hpf (ref 0–5)
Squamous Epithelial / LPF: NONE SEEN
WBC, UA: NONE SEEN WBC/hpf (ref 0–5)

## 2017-03-22 LAB — SEDIMENTATION RATE: SED RATE: 103 mm/h — AB (ref 0–16)

## 2017-03-22 MED ORDER — ACETAMINOPHEN 650 MG RE SUPP: 650.0000 mg | Freq: Four times a day (QID) | RECTAL | Status: DC | PRN

## 2017-03-22 MED ORDER — TRAMADOL HCL 50 MG PO TABS
50.0000 mg | ORAL_TABLET | Freq: Four times a day (QID) | ORAL | Status: DC | PRN
  Administered 2017-03-22: 50 mg via ORAL
  Filled 2017-03-22: qty 1

## 2017-03-22 MED ORDER — METHOCARBAMOL 500 MG PO TABS
500.0000 mg | ORAL_TABLET | Freq: Four times a day (QID) | ORAL | Status: DC | PRN
  Administered 2017-03-22 – 2017-03-23 (×2): 500 mg via ORAL
  Filled 2017-03-22 (×2): qty 1

## 2017-03-22 MED ORDER — DOCUSATE SODIUM 100 MG PO CAPS
100.0000 mg | ORAL_CAPSULE | Freq: Two times a day (BID) | ORAL | Status: DC
  Administered 2017-03-22: 100 mg via ORAL
  Filled 2017-03-22: qty 1

## 2017-03-22 MED ORDER — VANCOMYCIN HCL IN DEXTROSE 750-5 MG/150ML-% IV SOLN
750.0000 mg | Freq: Two times a day (BID) | INTRAVENOUS | Status: DC
  Administered 2017-03-23 – 2017-03-24 (×3): 750 mg via INTRAVENOUS
  Filled 2017-03-22 (×4): qty 150

## 2017-03-22 MED ORDER — ONDANSETRON HCL 4 MG PO TABS: 4.0000 mg | ORAL_TABLET | Freq: Four times a day (QID) | ORAL | Status: DC | PRN

## 2017-03-22 MED ORDER — ONDANSETRON HCL 4 MG/2ML IJ SOLN: 4.0000 mg | Freq: Four times a day (QID) | INTRAMUSCULAR | Status: DC | PRN

## 2017-03-22 MED ORDER — CHLORHEXIDINE GLUCONATE 4 % EX LIQD
60.0000 mL | Freq: Once | CUTANEOUS | Status: AC
  Administered 2017-03-23: 4 via TOPICAL
  Filled 2017-03-22: qty 60
  Filled 2017-03-22: qty 15

## 2017-03-22 MED ORDER — PIPERACILLIN-TAZOBACTAM 3.375 G IVPB
3.3750 g | Freq: Three times a day (TID) | INTRAVENOUS | Status: DC
Start: 2017-03-22 — End: 2017-03-24
  Administered 2017-03-22 – 2017-03-24 (×5): 3.375 g via INTRAVENOUS
  Filled 2017-03-22 (×7): qty 50

## 2017-03-22 MED ORDER — ACETAMINOPHEN 500 MG PO TABS
1000.0000 mg | ORAL_TABLET | Freq: Four times a day (QID) | ORAL | Status: DC
  Administered 2017-03-22 – 2017-03-23 (×3): 1000 mg via ORAL
  Filled 2017-03-22 (×3): qty 2

## 2017-03-22 MED ORDER — SENNOSIDES-DOCUSATE SODIUM 8.6-50 MG PO TABS: 1.0000 | ORAL_TABLET | Freq: Every evening | ORAL | Status: DC | PRN

## 2017-03-22 MED ORDER — KCL IN DEXTROSE-NACL 20-5-0.45 MEQ/L-%-% IV SOLN
INTRAVENOUS | Status: DC
  Administered 2017-03-22: via INTRAVENOUS
  Filled 2017-03-22 (×2): qty 1000

## 2017-03-22 MED ORDER — ACETAMINOPHEN 325 MG PO TABS: 650.0000 mg | ORAL_TABLET | Freq: Four times a day (QID) | ORAL | Status: DC | PRN

## 2017-03-22 MED ORDER — BISACODYL 5 MG PO TBEC
5.0000 mg | DELAYED_RELEASE_TABLET | Freq: Every day | ORAL | Status: DC | PRN
  Filled 2017-03-22: qty 1

## 2017-03-22 MED ORDER — VANCOMYCIN HCL 10 G IV SOLR
1500.0000 mg | Freq: Once | INTRAVENOUS | Status: AC
  Administered 2017-03-22: 1500 mg via INTRAVENOUS
  Filled 2017-03-22: qty 1500

## 2017-03-22 MED ORDER — POVIDONE-IODINE 10 % EX SWAB: 2.0000 "application " | Freq: Once | CUTANEOUS | Status: DC

## 2017-03-22 MED ORDER — FLEET ENEMA 7-19 GM/118ML RE ENEM: 1.0000 | ENEMA | Freq: Once | RECTAL | Status: DC | PRN

## 2017-03-22 MED ORDER — METHOCARBAMOL 1000 MG/10ML IJ SOLN
500.0000 mg | Freq: Four times a day (QID) | INTRAMUSCULAR | Status: DC | PRN
  Filled 2017-03-22: qty 5

## 2017-03-22 MED ORDER — DIPHENHYDRAMINE HCL 12.5 MG/5ML PO ELIX: 12.5000 mg | ORAL_SOLUTION | ORAL | Status: DC | PRN

## 2017-03-22 NOTE — H&P (Signed)
Gerald Lewis is an 82 y.o. male.   Chief Complaint: Left knee pain after total knee arthroplasty HPI: Gerald Lewis is an 82 year old gentleman who had a left total knee replacement with Dr. Roxy Horseman days ago.   He had a lot of trouble afterwards with hamstring spasm and wanted to keep his knee bent.  He was sent in from the nursing home today because of worsening pain and inability to extend the knee. He denies fevers or chills.  Past Medical History:  Diagnosis Date  . Cataract    B/L  . DJD (degenerative joint disease)   . Hypertension   . Pneumonia   . Wears glasses     Past Surgical History:  Procedure Laterality Date  . MULTIPLE TOOTH EXTRACTIONS    . NASAL FRACTURE SURGERY    . TOTAL KNEE ARTHROPLASTY Left 03/17/2017   Procedure: TOTAL KNEE ARTHROPLASTY;  Surgeon: Melrose Nakayama, MD;  Location: [place];  Service: Orthopedics;  Laterality: Left;    No family history on file. Social History:  reports that he has quit smoking. He has never used smokeless tobacco. He reports that he does not drink alcohol or use drugs.  Allergies:  Allergies  Allergen Reactions  . No Known Allergies      (Not in a hospital admission)  Results for orders placed or performed during the hospital encounter of 03/22/17 (from the past 48 hour(s))  Urinalysis, Routine w reflex microscopic     Status: Abnormal   Collection Time: 03/22/17  9:26 AM  Result Value Ref Range   Color, Urine STRAW (A) YELLOW   APPearance CLEAR CLEAR   Specific Gravity, Urine <1.005 (L) 1.005 - 1.030   pH 6.0 5.0 - 8.0   Glucose, UA NEGATIVE NEGATIVE mg/dL   Hgb urine dipstick TRACE (A) NEGATIVE   Bilirubin Urine NEGATIVE NEGATIVE   Ketones, ur NEGATIVE NEGATIVE mg/dL   Protein, ur NEGATIVE NEGATIVE mg/dL   Nitrite NEGATIVE NEGATIVE   Leukocytes, UA NEGATIVE NEGATIVE  Urinalysis, Microscopic (reflex)     Status: None   Collection Time: 03/22/17  9:26 AM  Result Value Ref Range   RBC / HPF NONE SEEN 0 - 5 RBC/hpf   WBC, UA NONE SEEN 0 - 5 WBC/hpf   Bacteria, UA NONE SEEN NONE SEEN   Squamous Epithelial / LPF NONE SEEN NONE SEEN  CBC with Differential/Platelet     Status: Abnormal   Collection Time: 03/22/17  9:49 AM  Result Value Ref Range   WBC 17.1 (H) 4.0 - 10.5 K/uL   RBC 3.55 (L) 4.22 - 5.81 MIL/uL   Hemoglobin 9.7 (L) 13.0 - 17.0 g/dL   HCT 28.5 (L) 39.0 - 52.0 %   MCV 80.3 78.0 - 100.0 fL   MCH 27.3 26.0 - 34.0 pg   MCHC 34.0 30.0 - 36.0 g/dL   RDW 15.4 11.5 - 15.5 %   Platelets 440 (H) 150 - 400 K/uL   Neutrophils Relative % 87 %   Neutro Abs 14.8 (H) 1.7 - 7.7 K/uL   Lymphocytes Relative 6 %   Lymphs Abs 1.0 0.7 - 4.0 K/uL   Monocytes Relative 6 %   Monocytes Absolute 1.0 0.1 - 1.0 K/uL   Eosinophils Relative 1 %   Eosinophils Absolute 0.1 0.0 - 0.7 K/uL   Basophils Relative 0 %   Basophils Absolute 0.0 0.0 - 0.1 K/uL  Basic metabolic panel     Status: Abnormal   Collection Time: 03/22/17  9:49 AM  Result  Value Ref Range   Sodium 136 135 - 145 mmol/L   Potassium 3.7 3.5 - 5.1 mmol/L   Chloride 102 101 - 111 mmol/L   CO2 24 22 - 32 mmol/L   Glucose, Bld 92 65 - 99 mg/dL   BUN 27 (H) 6 - 20 mg/dL   Creatinine, Ser 1.11 0.61 - 1.24 mg/dL   Calcium 8.2 (L) 8.9 - 10.3 mg/dL   GFR calc non Af Amer >60 >60 mL/min   GFR calc Af Amer >60 >60 mL/min    Comment: (NOTE) The eGFR has been calculated using the CKD EPI equation. This calculation has not been validated in all clinical situations. eGFR's persistently <60 mL/min signify possible Chronic Kidney Disease.    Anion gap 10 5 - 15   Dg Chest Port 1 View  Result Date: 03/22/2017 CLINICAL DATA:  82 year old male with a history of left knee pain EXAM: PORTABLE CHEST 1 VIEW COMPARISON:  03/11/2017, 01/31/2016 FINDINGS: Cardiomediastinal silhouette unchanged in size and contour. Calcifications of the aortic arch. Low lung volumes. No pneumothorax. Opacity at the left base with blunting of the left costophrenic angle. No evidence of  interlobular septal thickening. No displaced fracture. IMPRESSION: Low lung volumes with blunting of the left costophrenic angle, potentially atelectasis, although developing consolidation not excluded. If there is concern for acute lung process, formal PA and lateral chest x-ray may be useful. Electronically Signed   By: Corrie Mckusick D.O.   On: 03/22/2017 12:48   Dg Knee Complete 4 Views Left  Result Date: 03/22/2017 CLINICAL DATA:  82 y/o M; history of knee surgery 03/17/2017 with pain and swelling. EXAM: LEFT KNEE - COMPLETE 4+ VIEW COMPARISON:  None. FINDINGS: Total knee arthroplasty. No periprosthetic lucency or fracture identified. The femoral prosthesis is subluxed anteriorly and medially relative to the tibial prosthesis. There is a joint effusion and foci of air within soft tissues. IMPRESSION: Total knee arthroplasty with malalignment. Anterior and medial subluxation of the femoral prosthesis relative to the tibial prosthesis. Knee joint effusion and air within soft tissues may be postoperative given recent surgery. Electronically Signed   By: Kristine Garbe M.D.   On: 03/22/2017 13:23    Review of Systems  Constitutional: Negative for chills and fever.  Gastrointestinal: Positive for diarrhea.  Genitourinary: Positive for urgency.  All other systems reviewed and are negative.   Blood pressure (!) 166/99, pulse 99, temperature 98.9 F (37.2 C), temperature source Oral, resp. rate 18, SpO2 96 %. Physical Exam  Constitutional: He is oriented to person, place, and time. He appears well-developed and well-nourished.  HENT:  Head: Atraumatic.  Eyes: EOM are normal.  Cardiovascular: Intact distal pulses.   Respiratory: Effort normal.  Musculoskeletal:  Left lower extremity held flexed and the knee is in some varus.  Unable to fully extend the knee.  There is some erythema anteriorly on the knee but the incision looks good and there is no drainage.  He is fairly diffusely tender  about the knee.  No significant calf  Tenderness today.   Distally grossly neurovascularly intact.  Neurological: He is alert and oriented to person, place, and time.  Skin: Skin is warm and dry.  Psychiatric: He has a normal mood and affect.    Procedure: The knee was sterilely prepped with Betadine and infiltrated with about 3 cc 1% lidocaine without epinephrine.  An 18-gauge needle was then used through an anterolateral portal position on the need to aspirate about 15 cc of bloody fluid.  There is no evidence for pus.  The fluid did appear to be standard postoperative bloody fluid.  A light bandage was applied.  Assessment/Plan Dislocated left total knee replacement Given his increased white count and slight erythema around the joint there is some concern for the possibility of infection.  Therefore the joint was aspirated today at the bedside and a sterile fashion. The aspirate was not visually concerning for infection But was sent off to the lab for Gram stain culture and cell count. We will await the results of the fluid analysis before starting any antibiotics.  For now the plan will be for Dr. Rhona Raider to take him to the operating room tomorrow morning for closed versus open reduction and polyethylene exchange. If the fluid is concerning for infection he will be started on antibiotics overnight. He will be n.p.o. After midnight  Nita Sells, MD 03/22/2017, 2:07 PM

## 2017-03-22 NOTE — ED Triage Notes (Signed)
Patient here from [facility] with complaints of left knee pain 10/10. Recent left knee replacement. Edema noted around site, warm to touch, cant extend leg flat. Traadol and Robaxin with no relief.

## 2017-03-22 NOTE — ED Provider Notes (Signed)
[facility]-EMERGENCY DEPT Provider Note   CSN: [id number] Arrival date & time: 03/22/17  [id number]     History   Chief Complaint Chief Complaint  Patient presents with  . Knee Pain    HPI Gerald Lewis is a 82 y.o. male. Chief complaint is left knee pain.  HPI: Pt is 5 days s/p Lt TKA 2/2 osteoarthritis. DC'd on 6/16 to [facility]. Presents today with inability to flex/move Lt knee. Increased swelling, and redness to knee/LLE. No fever. N sob, cough, or other symptoms.  Past Medical History:  Diagnosis Date  . Cataract    B/L  . DJD (degenerative joint disease)   . Hypertension   . Pneumonia   . Wears glasses     Patient Active Problem List   Diagnosis Date Noted  . Loose total knee arthroplasty (HCC) 03/22/2017  . Primary osteoarthritis of left knee 03/17/2017    Past Surgical History:  Procedure Laterality Date  . MULTIPLE TOOTH EXTRACTIONS    . NASAL FRACTURE SURGERY    . TOTAL KNEE ARTHROPLASTY Left 03/17/2017   Procedure: TOTAL KNEE ARTHROPLASTY;  Surgeon: Marcene Corningalldorf, Peter, MD;  Location: [facility] OR;  Service: Orthopedics;  Laterality: Left;       Home Medications    Prior to Admission medications   Medication Sig Start Date End Date Taking? Authorizing Provider  ASPERCREME LIDOCAINE EX Apply 1 application topically daily as needed (pains).   Yes [provider]  aspirin EC 325 MG EC tablet Take 1 tablet (325 mg total) by mouth 2 (two) times daily after a meal. 03/20/17  Yes Nida, Greig CastillaAndrew, PA-C  bisacodyl (DULCOLAX) 5 MG EC tablet Take 1 tablet (5 mg total) by mouth daily as needed for moderate constipation. 03/20/17  Yes Elodia FlorenceNida, Andrew, PA-C  cholecalciferol (VITAMIN D) 1000 units tablet Take 1,000 Units by mouth daily.   Yes [provider]  docusate sodium (COLACE) 100 MG capsule Take 1 capsule (100 mg total) by mouth 2 (two) times daily. 03/20/17  Yes Elodia FlorenceNida, Andrew, PA-C  Ginkgo Biloba 40 MG TABS Take 1 tablet by mouth daily.   Yes [provider]   lisinopril (PRINIVIL,ZESTRIL) 20 MG tablet Take 20 mg by mouth daily.   Yes [provider]  Menthol-Methyl Salicylate (MUSCLE RUB) 10-15 % CREA Apply 1 application topically daily as needed for muscle pain.   Yes [provider]  methocarbamol (ROBAXIN) 500 MG tablet Take 1 tablet (500 mg total) by mouth every 6 (six) hours as needed for muscle spasms. 03/20/17  Yes Elodia FlorenceNida, Andrew, PA-C  Multiple Vitamins-Minerals (MULTIVITAMIN WITH MINERALS) tablet Take 1 tablet by mouth daily.   Yes [provider]  Omega-3 Fatty Acids (FISH OIL) 1000 MG CAPS Take 1 capsule by mouth daily.   Yes [provider]  saw palmetto 160 MG capsule Take 160 mg by mouth daily.   Yes [provider]  traMADol (ULTRAM) 50 MG tablet Take 1 tablet (50 mg total) by mouth every 6 (six) hours as needed for moderate pain or severe pain. 03/20/17  Yes Elodia FlorenceNida, Andrew, PA-C  Turmeric 500 MG CAPS Take 1 capsule by mouth daily.   Yes [provider]  vitamin C (ASCORBIC ACID) 500 MG tablet Take 1,000 mg by mouth daily.   Yes [provider]    Family History No family history on file.  Social History Social History  Substance Use Topics  . Smoking status: Former Games developermoker  . Smokeless tobacco: Never Used  . Alcohol use  No     Allergies   No known allergies   Review of Systems Review of Systems  Constitutional: Negative for appetite change, chills, diaphoresis, fatigue and fever.  HENT: Negative for mouth sores, sore throat and trouble swallowing.   Eyes: Negative for visual disturbance.  Respiratory: Negative for cough, chest tightness, shortness of breath and wheezing.   Cardiovascular: Negative for chest pain.  Gastrointestinal: Negative for abdominal distention, abdominal pain, diarrhea, nausea and vomiting.  Endocrine: Negative for polydipsia, polyphagia and polyuria.  Genitourinary: Negative for dysuria, frequency and hematuria.  Musculoskeletal: Positive  for arthralgias, joint swelling and myalgias. Negative for gait problem.  Skin: Positive for color change. Negative for pallor and rash.  Neurological: Negative for dizziness, syncope, light-headedness and headaches.  Hematological: Does not bruise/bleed easily.  Psychiatric/Behavioral: Negative for behavioral problems and confusion.     Physical Exam Updated Vital Signs BP (!) 169/76   Pulse (!) 103   Temp 98.9 F (37.2 C) (Oral)   Resp 20   SpO2 95%   Physical Exam  Constitutional: He is oriented to person, place, and time. He appears well-developed and well-nourished. No distress.  HENT:  Head: Normocephalic.  Eyes: Conjunctivae are normal. Pupils are equal, round, and reactive to light. No scleral icterus.  Neck: Normal range of motion. Neck supple. No thyromegaly present.  Cardiovascular: Normal rate and regular rhythm.  Exam reveals no gallop and no friction rub.   No murmur heard. Pulmonary/Chest: Effort normal and breath sounds normal. No respiratory distress. He has no wheezes. He has no rales.  Abdominal: Soft. Bowel sounds are normal. He exhibits no distension. There is no tenderness. There is no rebound.  Musculoskeletal: Normal range of motion.  STS and erythema to lt knee. Hesitant to move joint. 1-2+ edema to LLE below knee. 2+pt pulse and immediate CRF.  Neurological: He is alert and oriented to person, place, and time.  Skin: Skin is warm and dry. No rash noted.  Psychiatric: He has a normal mood and affect. His behavior is normal.     ED Treatments / Results  Labs (all labs ordered are listed, but only abnormal results are displayed) Labs Reviewed  CBC WITH DIFFERENTIAL/PLATELET - Abnormal; Notable for the following:       Result Value   WBC 17.1 (*)    RBC 3.55 (*)    Hemoglobin 9.7 (*)    HCT 28.5 (*)    Platelets 440 (*)    Neutro Abs 14.8 (*)    All other components within normal limits  BASIC METABOLIC PANEL - Abnormal; Notable for the following:     BUN 27 (*)    Calcium 8.2 (*)    All other components within normal limits  URINALYSIS, ROUTINE W REFLEX MICROSCOPIC - Abnormal; Notable for the following:    Color, Urine STRAW (*)    Specific Gravity, Urine <1.005 (*)    Hgb urine dipstick TRACE (*)    All other components within normal limits  SYNOVIAL CELL COUNT + DIFF, W/ CRYSTALS - Abnormal; Notable for the following:    Color, Synovial RED (*)    Appearance-Synovial TURBID (*)    WBC, Synovial 2,600 (*)    Neutrophil, Synovial 97 (*)    Monocyte-Macrophage-Synovial Fluid 1 (*)    All other components within normal limits  BODY FLUID CULTURE  URINE CULTURE  URINALYSIS, MICROSCOPIC (REFLEX)    EKG  EKG Interpretation None       Radiology Dg Chest [facility] [address]  Result Date: 03/22/2017 CLINICAL DATA:  82 year old male with a history of left knee pain EXAM: PORTABLE CHEST 1 VIEW COMPARISON:  03/11/2017, 01/31/2016 FINDINGS: Cardiomediastinal silhouette unchanged in size and contour. Calcifications of the aortic arch. Low lung volumes. No pneumothorax. Opacity at the left base with blunting of the left costophrenic angle. No evidence of interlobular septal thickening. No displaced fracture. IMPRESSION: Low lung volumes with blunting of the left costophrenic angle, potentially atelectasis, although developing consolidation not excluded. If there is concern for acute lung process, formal PA and lateral chest x-ray may be useful. Electronically Signed   By: Gilmer Mor D.O.   On: 03/22/2017 12:48   Dg Knee Complete 4 Views Left  Result Date: 03/22/2017 CLINICAL DATA:  82 y/o M; history of knee surgery 03/17/2017 with pain and swelling. EXAM: LEFT KNEE - COMPLETE 4+ VIEW COMPARISON:  None. FINDINGS: Total knee arthroplasty. No periprosthetic lucency or fracture identified. The femoral prosthesis is subluxed anteriorly and medially relative to the tibial prosthesis. There is a joint effusion and foci of air within soft tissues.  IMPRESSION: Total knee arthroplasty with malalignment. Anterior and medial subluxation of the femoral prosthesis relative to the tibial prosthesis. Knee joint effusion and air within soft tissues may be postoperative given recent surgery. Electronically Signed   By: Mitzi Hansen M.D.   On: 03/22/2017 13:23    Procedures Procedures (including critical care time)  Medications Ordered in ED Medications  acetaminophen (TYLENOL) tablet 650 mg (not administered)    Or  acetaminophen (TYLENOL) suppository 650 mg (not administered)  dextrose 5 % and 0.45 % NaCl with KCl 20 mEq/L infusion (not administered)  acetaminophen (TYLENOL) tablet 1,000 mg (not administered)  methocarbamol (ROBAXIN) tablet 500 mg (not administered)    Or  methocarbamol (ROBAXIN) 500 mg in dextrose 5 % 50 mL IVPB (not administered)  diphenhydrAMINE (BENADRYL) 12.5 MG/5ML elixir 12.5-25 mg (not administered)  docusate sodium (COLACE) capsule 100 mg (not administered)  senna-docusate (Senokot-S) tablet 1 tablet (not administered)  bisacodyl (DULCOLAX) EC tablet 5 mg (not administered)  sodium phosphate (FLEET) 7-19 GM/118ML enema 1 enema (not administered)  ondansetron (ZOFRAN) tablet 4 mg (not administered)    Or  ondansetron (ZOFRAN) injection 4 mg (not administered)  traMADol (ULTRAM) tablet 50 mg (not administered)     Initial Impression / Assessment and Plan / ED Course  I have reviewed the triage vital signs and the nursing notes.  Pertinent labs & imaging results that were available during my care of the patient were reviewed by me and considered in my medical decision making (see chart for details).   No fever. WBC 17K. No UTI or Pneumonia. K ray shows abnormal positioning of prosthetic componants.  Discussed with dr. Ave Filter of Ortho. Here, planning arthrocentesis and admit.  Final Clinical Impressions(s) / ED Diagnoses   Final diagnoses:  Leg pain    New Prescriptions Current Discharge  Medication List       Rolland Porter, MD 03/22/17 [phone]

## 2017-03-22 NOTE — ED Notes (Signed)
Bed: [id number] Expected date:  Expected time:  Means of arrival:  Comments: EMS/knee problem

## 2017-03-22 NOTE — Progress Notes (Signed)
Patient left the unit at approximately 2300 for [facility]. Gave report to [place]via on [facility] at approximately 2250. We were unable to get in touch with patient's son [name] but patient spoke with his son prior to transport and made him aware [facility]. Patient gave this number for [phone].

## 2017-03-22 NOTE — Progress Notes (Addendum)
VASCULAR LAB PRELIMINARY  PRELIMINARY  PRELIMINARY  PRELIMINARY  Left lower extremity venous duplex completed.    Preliminary report:  There is no DVT or SVT noted in the left lower extremity.   Gave report to Dr. Edward QualiaJames  Gerald Lewis, [facility], RVT 03/22/2017, 10:58 AM

## 2017-03-22 NOTE — ED Notes (Signed)
ED Provider at bedside. 

## 2017-03-22 NOTE — Progress Notes (Signed)
Pharmacy Antibiotic Note  Gerald Lewis is a 82 y.o. male admitted on 03/22/2017 with wound infection.  PMH significant for left TKA.  Presented with increased white count and slight erythema around the joint so concern for infection.  Pharmacy has been consulted for vancomycin and zosyn dosing.  Plan: Vancomycin 1500 mg x 1 then 750 mg IV q12h. Zosyn 3.375g IV q8h (4 hour infusion time).  Daily SCr. Check VT at steady state. F/u cultures for narrowing.     Temp (24hrs), Avg:99.3 F (37.4 C), Min:98.9 F (37.2 C), Max:99.7 F (37.6 C)   Recent Labs Lab 03/22/17 0949  WBC 17.1*  CREATININE 1.11    Estimated Creatinine Clearance: 60.2 mL/min (by C-G formula based on SCr of 1.11 mg/dL).    Allergies  Allergen Reactions  . No Known Allergies     Antimicrobials this admission: 6/17 vancomycin >>  6/17 zosyn >>   Dose adjustments this admission:  Microbiology results: 6/17 UCx: sent  6/17 synovial left knee culture:   Thank you for allowing pharmacy to be a part of this patient's care.  Clance BollRunyon, Charlissa Petros 03/22/2017 4:22 PM

## 2017-03-23 ENCOUNTER — Observation Stay (HOSPITAL_COMMUNITY): Payer: Medicare Other

## 2017-03-23 ENCOUNTER — Encounter (HOSPITAL_COMMUNITY)
Admission: EM | Disposition: A | Discharge status: Skilled Nursing Facility | Payer: Self-pay | Source: Home / Self Care | Attending: Orthopaedic Surgery

## 2017-03-23 ENCOUNTER — Observation Stay (HOSPITAL_COMMUNITY): Payer: Medicare Other | Admitting: Certified Registered Nurse Anesthetist

## 2017-03-23 HISTORY — PX: TOTAL KNEE ARTHROPLASTY: SHX125

## 2017-03-23 LAB — C-REACTIVE PROTEIN: CRP: 26.7 mg/dL — AB (ref <1.0)

## 2017-03-23 LAB — URINE CULTURE: CULTURE: NO GROWTH

## 2017-03-23 SURGERY — ARTHROPLASTY, KNEE, TOTAL
Anesthesia: General | Laterality: Left

## 2017-03-23 MED ORDER — ONDANSETRON HCL 4 MG/2ML IJ SOLN
INTRAMUSCULAR | Status: DC | PRN
  Administered 2017-03-23: 4 mg via INTRAVENOUS

## 2017-03-23 MED ORDER — METHOCARBAMOL 500 MG PO TABS
500.0000 mg | ORAL_TABLET | Freq: Four times a day (QID) | ORAL | Status: DC | PRN
  Administered 2017-03-24 – 2017-03-26 (×9): 500 mg via ORAL
  Filled 2017-03-23 (×9): qty 1

## 2017-03-23 MED ORDER — MENTHOL 3 MG MT LOZG: 1.0000 | LOZENGE | OROMUCOSAL | Status: DC | PRN

## 2017-03-23 MED ORDER — ONDANSETRON HCL 4 MG/2ML IJ SOLN: 4.0000 mg | Freq: Four times a day (QID) | INTRAMUSCULAR | Status: DC | PRN

## 2017-03-23 MED ORDER — ROCURONIUM BROMIDE 100 MG/10ML IV SOLN
INTRAVENOUS | Status: DC | PRN
Start: 2017-03-23 — End: 2017-03-23
  Administered 2017-03-23: 5 mg via INTRAVENOUS
  Administered 2017-03-23: 40 mg via INTRAVENOUS

## 2017-03-23 MED ORDER — HYDROMORPHONE HCL 1 MG/ML IJ SOLN
0.5000 mg | INTRAMUSCULAR | Status: DC | PRN
  Administered 2017-03-24: 0.5 mg via INTRAVENOUS
  Filled 2017-03-23: qty 1

## 2017-03-23 MED ORDER — FENTANYL CITRATE (PF) 100 MCG/2ML IJ SOLN: 25.0000 ug | INTRAMUSCULAR | Status: DC | PRN

## 2017-03-23 MED ORDER — 0.9 % SODIUM CHLORIDE (POUR BTL) OPTIME
TOPICAL | Status: DC | PRN
  Administered 2017-03-23: 1000 mL

## 2017-03-23 MED ORDER — PROPOFOL 10 MG/ML IV BOLUS
INTRAVENOUS | Status: AC
  Filled 2017-03-23: qty 20

## 2017-03-23 MED ORDER — BUPIVACAINE HCL (PF) 0.5 % IJ SOLN
INTRAMUSCULAR | Status: DC | PRN
  Administered 2017-03-23: 20 mL
  Administered 2017-03-23: 5 mL

## 2017-03-23 MED ORDER — TRANEXAMIC ACID 1000 MG/10ML IV SOLN
2000.0000 mg | Freq: Once | INTRAVENOUS | Status: AC
  Administered 2017-03-23: 2000 mg via TOPICAL
  Filled 2017-03-23: qty 20

## 2017-03-23 MED ORDER — SODIUM CHLORIDE 0.9 % IJ SOLN
INTRAMUSCULAR | Status: DC | PRN
  Administered 2017-03-23: 40 mL via INTRAVENOUS

## 2017-03-23 MED ORDER — TRANEXAMIC ACID 1000 MG/10ML IV SOLN
1000.0000 mg | Freq: Once | INTRAVENOUS | Status: AC
  Administered 2017-03-23: 1000 mg via INTRAVENOUS
  Filled 2017-03-23: qty 10

## 2017-03-23 MED ORDER — FENTANYL CITRATE (PF) 250 MCG/5ML IJ SOLN
INTRAMUSCULAR | Status: AC
  Filled 2017-03-23: qty 5

## 2017-03-23 MED ORDER — HYDROMORPHONE HCL 1 MG/ML IJ SOLN
INTRAMUSCULAR | Status: DC | PRN
  Administered 2017-03-23 (×2): .25 mg via INTRAVENOUS

## 2017-03-23 MED ORDER — CEFUROXIME SODIUM 750 MG IJ SOLR
INTRAMUSCULAR | Status: DC | PRN
  Administered 2017-03-23: 1.5 g

## 2017-03-23 MED ORDER — TRAMADOL HCL 50 MG PO TABS
50.0000 mg | ORAL_TABLET | Freq: Four times a day (QID) | ORAL | Status: DC | PRN
  Administered 2017-03-23 – 2017-03-27 (×14): 100 mg via ORAL
  Filled 2017-03-23 (×14): qty 2

## 2017-03-23 MED ORDER — HYDROMORPHONE HCL 1 MG/ML IJ SOLN
0.2500 mg | INTRAMUSCULAR | Status: DC | PRN
  Administered 2017-03-23: 0.25 mg via INTRAVENOUS
  Filled 2017-03-23: qty 1

## 2017-03-23 MED ORDER — HYDROMORPHONE HCL 1 MG/ML IJ SOLN
INTRAMUSCULAR | Status: AC
  Filled 2017-03-23: qty 0.5

## 2017-03-23 MED ORDER — METHYLPREDNISOLONE ACETATE 80 MG/ML IJ SUSP
INTRAMUSCULAR | Status: AC
  Filled 2017-03-23: qty 1

## 2017-03-23 MED ORDER — SODIUM CHLORIDE 0.9 % IR SOLN
Status: DC | PRN
  Administered 2017-03-23: 3000 mL

## 2017-03-23 MED ORDER — PHENYLEPHRINE HCL 10 MG/ML IJ SOLN
INTRAMUSCULAR | Status: DC | PRN
  Administered 2017-03-23: 80 ug via INTRAVENOUS
  Administered 2017-03-23: 40 ug via INTRAVENOUS
  Administered 2017-03-23: 80 ug via INTRAVENOUS
  Administered 2017-03-23 (×2): 40 ug via INTRAVENOUS
  Administered 2017-03-23: 80 ug via INTRAVENOUS
  Administered 2017-03-23: 40 ug via INTRAVENOUS

## 2017-03-23 MED ORDER — ASPIRIN EC 325 MG PO TBEC
325.0000 mg | DELAYED_RELEASE_TABLET | Freq: Two times a day (BID) | ORAL | Status: DC
  Administered 2017-03-24 – 2017-03-27 (×7): 325 mg via ORAL
  Filled 2017-03-23 (×7): qty 1

## 2017-03-23 MED ORDER — METOCLOPRAMIDE HCL 5 MG PO TABS
5.0000 mg | ORAL_TABLET | Freq: Three times a day (TID) | ORAL | Status: DC | PRN
Start: 2017-03-23 — End: 2017-03-27

## 2017-03-23 MED ORDER — LACTATED RINGERS IV SOLN
INTRAVENOUS | Status: DC | PRN
  Administered 2017-03-23: 08:00:00 via INTRAVENOUS

## 2017-03-23 MED ORDER — LACTATED RINGERS IV SOLN
INTRAVENOUS | Status: DC
  Administered 2017-03-23 – 2017-03-25 (×2): via INTRAVENOUS

## 2017-03-23 MED ORDER — BUPIVACAINE-EPINEPHRINE (PF) 0.5% -1:200000 IJ SOLN: INTRAMUSCULAR | Status: DC | PRN

## 2017-03-23 MED ORDER — ACETAMINOPHEN 325 MG PO TABS
650.0000 mg | ORAL_TABLET | Freq: Four times a day (QID) | ORAL | Status: DC | PRN
  Administered 2017-03-24 – 2017-03-27 (×5): 650 mg via ORAL
  Filled 2017-03-23 (×6): qty 2

## 2017-03-23 MED ORDER — DIPHENHYDRAMINE HCL 12.5 MG/5ML PO ELIX
12.5000 mg | ORAL_SOLUTION | ORAL | Status: DC | PRN
Start: 2017-03-23 — End: 2017-03-27

## 2017-03-23 MED ORDER — LIDOCAINE HCL (CARDIAC) 20 MG/ML IV SOLN
INTRAVENOUS | Status: DC | PRN
  Administered 2017-03-23: 100 mg via INTRAVENOUS

## 2017-03-23 MED ORDER — BUPIVACAINE LIPOSOME 1.3 % IJ SUSP: INTRAMUSCULAR | Status: DC | PRN

## 2017-03-23 MED ORDER — BUPIVACAINE HCL (PF) 0.5 % IJ SOLN
INTRAMUSCULAR | Status: AC
  Filled 2017-03-23: qty 30

## 2017-03-23 MED ORDER — PHENOL 1.4 % MT LIQD: 1.0000 | OROMUCOSAL | Status: DC | PRN

## 2017-03-23 MED ORDER — TRANEXAMIC ACID 1000 MG/10ML IV SOLN
1000.0000 mg | INTRAVENOUS | Status: AC
  Administered 2017-03-23: 1000 mg via INTRAVENOUS
  Filled 2017-03-23: qty 10

## 2017-03-23 MED ORDER — METOCLOPRAMIDE HCL 5 MG/ML IJ SOLN: 5.0000 mg | Freq: Three times a day (TID) | INTRAMUSCULAR | Status: DC | PRN

## 2017-03-23 MED ORDER — SUCCINYLCHOLINE CHLORIDE 20 MG/ML IJ SOLN
INTRAMUSCULAR | Status: DC | PRN
  Administered 2017-03-23: 120 mg via INTRAVENOUS

## 2017-03-23 MED ORDER — BISACODYL 5 MG PO TBEC
5.0000 mg | DELAYED_RELEASE_TABLET | Freq: Every day | ORAL | Status: DC | PRN
  Administered 2017-03-26: 5 mg via ORAL
  Filled 2017-03-23: qty 1

## 2017-03-23 MED ORDER — ALUM & MAG HYDROXIDE-SIMETH 200-200-20 MG/5ML PO SUSP: 30.0000 mL | ORAL | Status: DC | PRN

## 2017-03-23 MED ORDER — FENTANYL CITRATE (PF) 100 MCG/2ML IJ SOLN
INTRAMUSCULAR | Status: DC | PRN
  Administered 2017-03-23: 50 ug via INTRAVENOUS
  Administered 2017-03-23: 25 ug via INTRAVENOUS
  Administered 2017-03-23 (×2): 50 ug via INTRAVENOUS
  Administered 2017-03-23: 75 ug via INTRAVENOUS

## 2017-03-23 MED ORDER — ONDANSETRON HCL 4 MG PO TABS: 4.0000 mg | ORAL_TABLET | Freq: Four times a day (QID) | ORAL | Status: DC | PRN

## 2017-03-23 MED ORDER — SUGAMMADEX SODIUM 200 MG/2ML IV SOLN
INTRAVENOUS | Status: DC | PRN
  Administered 2017-03-23: 180 mg via INTRAVENOUS

## 2017-03-23 MED ORDER — METHYLPREDNISOLONE ACETATE 80 MG/ML IJ SUSP
INTRAMUSCULAR | Status: DC | PRN
  Administered 2017-03-23: 80 mg via INTRA_ARTICULAR

## 2017-03-23 MED ORDER — PROPOFOL 10 MG/ML IV BOLUS
INTRAVENOUS | Status: DC | PRN
  Administered 2017-03-23: 130 mg via INTRAVENOUS

## 2017-03-23 MED ORDER — BUPIVACAINE LIPOSOME 1.3 % IJ SUSP
20.0000 mL | Freq: Once | INTRAMUSCULAR | Status: AC
  Administered 2017-03-23: 20 mL
  Filled 2017-03-23: qty 20

## 2017-03-23 MED ORDER — LISINOPRIL 20 MG PO TABS
20.0000 mg | ORAL_TABLET | Freq: Every day | ORAL | Status: DC
  Administered 2017-03-23 – 2017-03-27 (×5): 20 mg via ORAL
  Filled 2017-03-23 (×5): qty 1

## 2017-03-23 MED ORDER — METHOCARBAMOL 1000 MG/10ML IJ SOLN
500.0000 mg | Freq: Four times a day (QID) | INTRAVENOUS | Status: DC | PRN
  Filled 2017-03-23: qty 5

## 2017-03-23 MED ORDER — DOCUSATE SODIUM 100 MG PO CAPS
100.0000 mg | ORAL_CAPSULE | Freq: Two times a day (BID) | ORAL | Status: DC
  Administered 2017-03-23 – 2017-03-27 (×7): 100 mg via ORAL
  Filled 2017-03-23 (×9): qty 1

## 2017-03-23 MED ORDER — ACETAMINOPHEN 650 MG RE SUPP: 650.0000 mg | Freq: Four times a day (QID) | RECTAL | Status: DC | PRN

## 2017-03-23 SURGICAL SUPPLY — 61 items
AUGMENT POSTERIOR PFC SZ4 4MM (Knees) ×2 IMPLANT
BAG DECANTER FOR FLEXI CONT (MISCELLANEOUS) ×3 IMPLANT
BANDAGE ACE 4X5 VEL STRL LF (GAUZE/BANDAGES/DRESSINGS) IMPLANT
BANDAGE ELASTIC 6 VELCRO ST LF (GAUZE/BANDAGES/DRESSINGS) ×3 IMPLANT
BANDAGE ESMARK 6X9 LF (GAUZE/BANDAGES/DRESSINGS) ×1 IMPLANT
BLADE SAGITTAL 25.0X1.19X90 (BLADE) ×2 IMPLANT
BLADE SAGITTAL 25.0X1.19X90MM (BLADE) ×1
BLADE SAW SGTL 13.0X1.19X90.0M (BLADE) ×3 IMPLANT
BNDG ELASTIC 6X10 VLCR STRL LF (GAUZE/BANDAGES/DRESSINGS) IMPLANT
BNDG ESMARK 6X9 LF (GAUZE/BANDAGES/DRESSINGS) ×3
BNDG GAUZE ELAST 4 BULKY (GAUZE/BANDAGES/DRESSINGS) IMPLANT
BOWL SMART MIX CTS (DISPOSABLE) ×3 IMPLANT
CEMENT HV SMART SET (Cement) ×3 IMPLANT
CLOSURE WOUND 1/2 X4 (GAUZE/BANDAGES/DRESSINGS) ×1
COVER SURGICAL LIGHT HANDLE (MISCELLANEOUS) ×3 IMPLANT
CUFF TOURNIQUET SINGLE 34IN LL (TOURNIQUET CUFF) ×3 IMPLANT
CUFF TOURNIQUET SINGLE 44IN (TOURNIQUET CUFF) IMPLANT
DECANTER SPIKE VIAL GLASS SM (MISCELLANEOUS) ×3 IMPLANT
DRAPE EXTREMITY T 121X128X90 (DRAPE) ×3 IMPLANT
DRAPE HALF SHEET 40X57 (DRAPES) ×6 IMPLANT
DRAPE U-SHAPE 47X51 STRL (DRAPES) ×3 IMPLANT
DRESSING AQUACEL AG SP 3.5X10 (GAUZE/BANDAGES/DRESSINGS) ×1 IMPLANT
DRSG ADAPTIC 3X8 NADH LF (GAUZE/BANDAGES/DRESSINGS) IMPLANT
DRSG AQUACEL AG SP 3.5X10 (GAUZE/BANDAGES/DRESSINGS) ×3
DRSG PAD ABDOMINAL 8X10 ST (GAUZE/BANDAGES/DRESSINGS) ×3 IMPLANT
DURAPREP 26ML APPLICATOR (WOUND CARE) ×6 IMPLANT
ELECT REM PT RETURN 9FT ADLT (ELECTROSURGICAL) ×3
ELECTRODE REM PT RTRN 9FT ADLT (ELECTROSURGICAL) ×1 IMPLANT
FEMUR SIGMA PS SZ 4.0 L (Knees) ×3 IMPLANT
GAUZE SPONGE 4X4 12PLY STRL (GAUZE/BANDAGES/DRESSINGS) IMPLANT
GLOVE BIO SURGEON STRL SZ8 (GLOVE) ×6 IMPLANT
GLOVE BIOGEL PI IND STRL 8 (GLOVE) ×2 IMPLANT
GLOVE BIOGEL PI INDICATOR 8 (GLOVE) ×4
GOWN STRL REUS W/ TWL LRG LVL3 (GOWN DISPOSABLE) ×1 IMPLANT
GOWN STRL REUS W/ TWL XL LVL3 (GOWN DISPOSABLE) ×2 IMPLANT
GOWN STRL REUS W/TWL LRG LVL3 (GOWN DISPOSABLE) ×2
GOWN STRL REUS W/TWL XL LVL3 (GOWN DISPOSABLE) ×4
HANDPIECE INTERPULSE COAX TIP (DISPOSABLE) ×2
HOOD PEEL AWAY FACE SHEILD DIS (HOOD) ×6 IMPLANT
IMMOBILIZER KNEE 22 UNIV (SOFTGOODS) ×3 IMPLANT
KIT BASIN OR (CUSTOM PROCEDURE TRAY) ×3 IMPLANT
KIT ROOM TURNOVER OR (KITS) ×3 IMPLANT
MANIFOLD NEPTUNE II (INSTRUMENTS) ×3 IMPLANT
NEEDLE HYPO 21X1 ECLIPSE (NEEDLE) ×3 IMPLANT
NS IRRIG 1000ML POUR BTL (IV SOLUTION) ×3 IMPLANT
PACK TOTAL JOINT (CUSTOM PROCEDURE TRAY) ×3 IMPLANT
PAD ARMBOARD 7.5X6 YLW CONV (MISCELLANEOUS) ×6 IMPLANT
PLATE ROT INSERT 15MM SIZE 4 (Plate) ×3 IMPLANT
POSTERIOR AUGMENT PFC SZ4 4MM (Knees) ×6 IMPLANT
SET HNDPC FAN SPRY TIP SCT (DISPOSABLE) ×1 IMPLANT
STRIP CLOSURE SKIN 1/2X4 (GAUZE/BANDAGES/DRESSINGS) ×2 IMPLANT
SUT VIC AB 0 CT1 27 (SUTURE) ×2
SUT VIC AB 0 CT1 27XBRD ANBCTR (SUTURE) ×1 IMPLANT
SUT VIC AB 2-0 CT1 27 (SUTURE) ×2
SUT VIC AB 2-0 CT1 TAPERPNT 27 (SUTURE) ×1 IMPLANT
SUT VIC AB 3-0 FS2 27 (SUTURE) ×3 IMPLANT
SUT VLOC 180 0 24IN GS25 (SUTURE) ×3 IMPLANT
SYR 50ML LL SCALE MARK (SYRINGE) ×3 IMPLANT
TOWEL OR 17X24 6PK STRL BLUE (TOWEL DISPOSABLE) ×3 IMPLANT
TOWEL OR 17X26 10 PK STRL BLUE (TOWEL DISPOSABLE) ×3 IMPLANT
TRAY CATH 16FR W/PLASTIC CATH (SET/KITS/TRAYS/PACK) IMPLANT

## 2017-03-23 NOTE — Evaluation (Signed)
Physical Therapy Evaluation Patient Details Name: Gerald Lewis MRN: [id number] DOB: September 14, 1935 Today's Date: 03/23/2017   History of Present Illness  Gerald Lewis is an 82 year old gentleman who had a left total knee replacement with Dr. Jola Schmidt days ago.   He had a lot of trouble afterwards with hamstring spasm and wanted to keep his knee bent.  He was sent in from the nursing home today because of worsening pain and inability to extend the knee.. Pt underwent L TKA revision on 6/18. Pt was at [facility].  Clinical Impression  Pt lethargic and perseverating on "Lord help me" "take the pain away." Pt con't to be extremely rigid holding bilat LE in adduction with bilat knees flexed. Pt unable to participate in any L knee exercises or assist with transfers. Acute PT to follow. Recommend returning to SNF upon d/c.    Follow Up Recommendations SNF    Equipment Recommendations  Other (comment) (TBD)    Recommendations for Other Services       Precautions / Restrictions Precautions Precautions: Fall;Knee Precaution Comments: pt lethargic with difficulty following commands Restrictions Weight Bearing Restrictions: Yes LLE Weight Bearing: Weight bearing as tolerated      Mobility  Bed Mobility Overal bed mobility: Needs Assistance Bed Mobility: Supine to Sit;Sit to Supine     Supine to sit: HOB elevated;Total assist;+2 for physical assistance Sit to supine: +2 for physical assistance;Total assist   General bed mobility comments: pt unable to initiate or assist with transfer, dependent for LE management and trunk elevation  Transfers Overall transfer level: Needs assistance Equipment used:  (2 person lift with gait belt and pad) Transfers: Sit to/from Stand           General transfer comment: attempted sit to stand but unable to clear bottom from bed, pt would not put L LE down to [facility]  Ambulation/Gait                Stairs            Wheelchair Mobility     Modified Rankin (Stroke Patients Only)       Balance Overall balance assessment: Needs assistance Sitting-balance support: Feet supported;Bilateral upper extremity supported;No upper extremity supported Sitting balance-Leahy Scale: Poor Sitting balance - Comments: Pt has post lean without UE support.    Standing balance support: Bilateral upper extremity supported;During functional activity;No upper extremity supported Standing balance-Leahy Scale: Zero Standing balance comment: unable to achieve standing with assist                             Pertinent Vitals/Pain Pain Assessment: Faces Pain Score: 10-Worst pain ever Pain Location: L knee Pain Descriptors / Indicators: Constant;Discomfort;Grimacing;Guarding;Operative site guarding;Sharp;Shooting Pain Intervention(s): Limited activity within patient's tolerance    Home Living Family/patient expects to be discharged to:: Skilled nursing facility Living Arrangements: Alone               Additional Comments: was at [facility] PTA    Prior Function Level of Independence: Needs assistance   Gait / Transfers Assistance Needed: pt unable to amb due ot L knee flexion contracture and pain  ADL's / Homemaking Assistance Needed: dependent on SNF staff        Hand Dominance   Dominant Hand: Right    Extremity/Trunk Assessment   Upper Extremity Assessment Upper Extremity Assessment: Generalized weakness    Lower Extremity Assessment Lower Extremity Assessment: RLE deficits/detail RLE Deficits / Details:  very stiff and rigid, some active knee flexion, tight adductors and hamstrings LLE Deficits / Details: L TKA, pt resistive to ROM due to pain, holds LE in adduction and knee flexion    Cervical / Trunk Assessment Cervical / Trunk Assessment: Kyphotic  Communication   Communication: HOH  Cognition Arousal/Alertness: Lethargic;Suspect due to medications Behavior During Therapy: WFL for tasks  assessed/performed Overall Cognitive Status: Difficult to assess                                 General Comments: pt lethargic but did follow simple commands depsite eyes closed      General Comments General comments (skin integrity, edema, etc.): pt with L knee held in flexion    Exercises Total Joint Exercises Knee Flexion: PROM;5 reps;Supine (pt resistive and only able to tolerate a few degrees)   Assessment/Plan    PT Assessment Patient needs continued PT services  PT Problem List Decreased strength;Decreased range of motion;Decreased activity tolerance;Decreased balance;Decreased mobility;Decreased knowledge of use of DME;Pain       PT Treatment Interventions DME instruction;Gait training;Functional mobility training;Therapeutic activities;Therapeutic exercise;Patient/family education    PT Goals (Current goals can be found in the Care Plan section)  Acute Rehab PT Goals Patient Stated Goal: Stop pain PT Goal Formulation: With patient Time For Goal Achievement: 04/03/17 Potential to Achieve Goals: Fair    Frequency 7X/week   Barriers to discharge        Co-evaluation               AM-PAC PT "6 Clicks" Daily Activity  Outcome Measure Difficulty turning over in bed (including adjusting bedclothes, sheets and blankets)?: Total Difficulty moving from lying on back to sitting on the side of the bed? : Total Difficulty sitting down on and standing up from a chair with arms (e.g., wheelchair, bedside commode, etc,.)?: Total Help needed moving to and from a bed to chair (including a wheelchair)?: Total Help needed walking in hospital room?: Total Help needed climbing 3-5 steps with a railing? : Total 6 Click Score: 6    End of Session Equipment Utilized During Treatment: Gait belt Activity Tolerance: Patient limited by pain Patient left: in bed;with call bell/phone within reach Nurse Communication: Mobility status PT Visit Diagnosis: Repeated  falls (R29.6);Difficulty in walking, not elsewhere classified (R26.2);Pain Pain - Right/Left: Left Pain - part of body: Knee;Leg    Time: 1610-96041408-1431 PT Time Calculation (min) (ACUTE ONLY): 23 min   Charges:   PT Evaluation $PT Eval High Complexity: 1 Procedure     PT G Codes:   PT G-Codes **NOT FOR INPATIENT CLASS** Functional Assessment Tool Used: Clinical judgement Functional Limitation: Mobility: Walking and moving around Mobility: Walking and Moving Around Current Status ([id number](G8978): At least 80 percent but less than 100 percent impaired, limited or restricted Mobility: Walking and Moving Around Goal Status [phone](G8979): At least 20 percent but less than 40 percent impaired, limited or restricted    Lewis ShockAshly Gianne Shugars, PT, DPT Pager #: [phone] Office #: [phone]  Rozell Searingshly M Breanna Shorkey 03/23/2017, 3:40 PM

## 2017-03-23 NOTE — Op Note (Signed)
NAME:  NISAIAH, BECHTOL                     ACCOUNT NO.:  MEDICAL RECORD NO.:  [id number]  LOCATION:                                 FACILITY:  PHYSICIAN:  Lubertha Basque. Jerl Santos, M.D.     DATE OF BIRTH:  DATE OF PROCEDURE:  03/23/2017 DATE OF DISCHARGE:                              OPERATIVE REPORT   PREOPERATIVE DIAGNOSIS:  Left total knee replacement dislocation.  POSTOPERATIVE DIAGNOSIS:  Left total knee replacement dislocation.  PROCEDURE:  Left total knee replacement revision.  ANESTHESIA:  General and block.  SURGEON:  Lubertha Basque. Jerl Santos, M.D.  ASSISTANT:  Elodia Florence, PA.  INDICATION FOR PROCEDURE:  The patient is an 82 year old man about a week from a left knee replacement.  He did well in the hospital and had some postoperative films the first day out of surgery which showed a well-located knee.  He subsequently was discharged to a rehab facility. He presented yesterday to the emergency room with increased pain and deformity.  X-ray revealed dislocation of the replaced knee.  An aspiration was performed and returned about 2000 white cells with a benign Gram stain.  His presumed problem is instability.  We elected to take him to the operating room for closed versus open revision of his knee replacement and thorough irrigation and debridement.  Informed operative consent was obtained after discussion of possible complications including reaction to anesthesia, infection, neurovascular injury.  SUMMARY OF FINDINGS AND PROCEDURE:  Under general anesthesia, we initially performed a closed reduction under fluoroscopic guidance. This seemed very stable.  He then would contract his hamstrings forcibly and could pull the tibia in a posterior direction.  We elected to revise with a posterior stabilized component.  He was then approached through his old incision, and we removed the femur with minimal bone loss and placed a size 4 Sigma PS femoral component with a 15  posterior stabilized rotating platform.  This gave [place] excellent stability.  Elodia Florence assisted throughout and was invaluable to the completion of the case in that he helped retract and close and to complete the case in an expeditious fashion.  DESCRIPTION OF PROCEDURE:  The patient was taken to the operating suite where general anesthetic was applied without difficulty.  He was positioned supine.  We then performed a closed reduction as mentioned above.  Unfortunately, this was stable with passive range of motion but when he actively contracted his hamstrings, he could dislocate in flexion.  We elected to revise.  He was then prepped and draped in normal sterile fashion.  He was already on some vancomycin and Zosyn. We did not add any other perioperative antibiotic.  The leg was elevated, exsanguinated, and tourniquet inflated about the thigh.  We utilized his old incision with dissection down the extensor mechanism. This repair was then taken apart.  His knee was flexed.  At this point, the poly had spun into 180-degree rotated position.  The poly was removed.  We elected to revise the femur.  We used flexible osteotomes and a slap hammer and removed the femoral component with minimal bone loss.  We then placed an intramedullary guide and made a  revision distal cut followed by chamfer cuts for the size 4 Sigma component.  We also made our box cut and removed the central portion.  We performed a trial reduction, and he had excellent stability with this Sigma PS component and a size 15 PS spacer.  I could not dislocate his knee in any direction.  The trial components removed.  We used pulsatile lavage across all cut bony surfaces.  I performed a thorough synovectomy though the synovium looked relatively benign.  We then cemented on the Sigma PS 4 component.  I did include vancomycin in the cement.  Excess cement was trimmed and pressure was held on the component until the cement  had hardened.  We then placed a size 15 posterior stabilized rotating platform component.  Again, the knee was stable in all positions.  His motion was full.  We again irrigated followed by release of tourniquet. A small amount of bleeding was easily controlled with some pressure and Bovie cautery.  We then irrigated again followed by reapproximation of the extensor mechanism with a medial parapatellar incision through the extensor mechanism with a V-Loc suture.  Subcutaneous tissues reapproximated in 2 layers of Vicryl followed by skin closure with subcuticular stitch and some short Steri-Strips.  Adaptic was applied followed by a dry gauze dressing and loose Ace wrap.  We did place him in a knee immobilizer at the end of the case as well.  Estimated blood loss and fluids, obtained from anesthesia records as can accurate tourniquet time.  DISPOSITION:  The patient was extubated in operating room and taken to recovery room in stable addition.  He is to be admitted back to the Orthopedic Surgery Service for appropriate postop care to include perioperative antibiotics and aspirin for DVT prophylaxis.     Lubertha BasquePeter G. Jerl Santosalldorf, M.D.     PGD/MEDQ  D:  03/23/2017  T:  03/23/2017  Job:  [id number]

## 2017-03-23 NOTE — Progress Notes (Signed)
Patient arrived to 5N at approximately 2320.[facility].

## 2017-03-23 NOTE — Anesthesia Preprocedure Evaluation (Addendum)
Anesthesia Evaluation  Patient identified by MRN, date of birth, ID band Patient awake    Reviewed: Allergy & Precautions, NPO status , Patient's Chart, lab work & pertinent test results  Airway Mallampati: II  TM Distance: >3 FB     Dental   Pulmonary pneumonia, former smoker,    breath sounds clear to auscultation       Cardiovascular hypertension,  Rhythm:Regular Rate:Normal     Neuro/Psych    GI/Hepatic negative GI ROS, Neg liver ROS, (+)     (-) substance abuse  ,   Endo/Other  negative endocrine ROS  Renal/GU negative Renal ROS     Musculoskeletal  (+) Arthritis ,   Abdominal   Peds  Hematology   Anesthesia Other Findings   Reproductive/Obstetrics                            Anesthesia Physical Anesthesia Plan  ASA: III  Anesthesia Plan: General   Post-op Pain Management:    Induction: Intravenous  PONV Risk Score and Plan: 2 and Ondansetron and Propofol  Airway Management Planned: LMA  Additional Equipment:   Intra-op Plan:   Post-operative Plan: Extubation in OR  Informed Consent: I have reviewed the patients History and Physical, chart, labs and discussed the procedure including the risks, benefits and alternatives for the proposed anesthesia with the patient or authorized representative who has indicated his/her understanding and acceptance.   Dental advisory given  Plan Discussed with: CRNA and Anesthesiologist  Anesthesia Plan Comments:        Anesthesia Quick Evaluation

## 2017-03-23 NOTE — Anesthesia Procedure Notes (Signed)
Procedure Name: Intubation Date/Time: 03/23/2017 7:45 AM Performed by: Moshe Cipro Mako Pelfrey ANN Pre-anesthesia Checklist: Patient identified, Emergency Drugs available, Suction available and Patient being monitored Patient Re-evaluated:Patient Re-evaluated prior to inductionOxygen Delivery Method: Circle system utilized Preoxygenation: Pre-oxygenation with 100% oxygen Intubation Type: IV induction Ventilation: Mask ventilation without difficulty Laryngoscope Size: Mac and 3 Grade View: Grade I Tube type: Oral Tube size: 7.0 mm Number of attempts: 1 Airway Equipment and Method: Stylet Placement Confirmation: ETT inserted through vocal cords under direct vision,  positive ETCO2,  CO2 detector and breath sounds checked- equal and bilateral Secured at: 22 cm Tube secured with: Tape

## 2017-03-23 NOTE — Transfer of Care (Signed)
Immediate Anesthesia Transfer of Care Note  Patient: Gerald Lewis  Procedure(s) Performed: Procedure(s): ATTEMPTED CLOSED REDUCTION TOTAL KNEE  INJECTION RIGHT KNEE  TOTAL KNEE REVISION LEFT KNEE (Left)  Patient Location: PACU  Anesthesia Type:General  Level of Consciousness: drowsy and patient cooperative  Airway & Oxygen Therapy: Patient Spontanous Breathing and Patient connected to nasal cannula oxygen  Post-op Assessment: Report given to RN and Post -op Vital signs reviewed and stable  Post vital signs: Reviewed and stable  Last Vitals:  Vitals:   03/23/17 0500 03/23/17 1057  BP: 140/62   Pulse: 82   Resp: 18   Temp: 36.6 C (P) 36.3 C    Last Pain:  Vitals:   03/23/17 0500  TempSrc: Oral  PainSc:       Patients Stated Pain Goal: 2 (03/22/17 1948)  Complications: No apparent anesthesia complications

## 2017-03-23 NOTE — Anesthesia Postprocedure Evaluation (Signed)
Anesthesia Post Note  Patient: Ardis HughsCarl E Vereen  Procedure(s) Performed: Procedure(s) (LRB): ATTEMPTED CLOSED REDUCTION TOTAL KNEE  INJECTION RIGHT KNEE  TOTAL KNEE REVISION LEFT KNEE (Left)     Patient location during evaluation: PACU Anesthesia Type: General Level of consciousness: awake Pain management: pain level controlled Vital Signs Assessment: post-procedure vital signs reviewed and stable Respiratory status: spontaneous breathing Cardiovascular status: stable Anesthetic complications: no    Last Vitals:  Vitals:   03/23/17 1120 03/23/17 1159  BP: 124/68 (!) 125/57  Pulse: 76 75  Resp: 15 15  Temp: 36.3 C     Last Pain:  Vitals:   03/23/17 0500  TempSrc: Oral  PainSc:                  Chrisma Hurlock

## 2017-03-23 NOTE — Interval H&P Note (Signed)
History and Physical Interval Note:  03/23/2017 7:15 AM  Gerald Lewis  has presented today for surgery, with the diagnosis of DISLOCATED TOTAL KNEE-LEFT  The various methods of treatment have been discussed with the patient and family. After consideration of risks, benefits and other options for treatment, the patient has consented to  Procedure(s): CLOSED REDUCTION TOTAL KNEE / POSSIBLE OPEN POLY EXCHANGE (Left) as a surgical intervention .  The patient's history has been reviewed, patient examined, no change in status, stable for surgery.  I have reviewed the patient's chart and labs.  Questions were answered to the patient's satisfaction.     Edker Punt G

## 2017-03-23 NOTE — Op Note (Signed)
#  [id number] 

## 2017-03-24 ENCOUNTER — Encounter (HOSPITAL_COMMUNITY): Payer: Self-pay | Admitting: Orthopaedic Surgery

## 2017-03-24 DIAGNOSIS — D62 Acute posthemorrhagic anemia: Secondary | ICD-10-CM | POA: Diagnosis not present

## 2017-03-24 DIAGNOSIS — I10 Essential (primary) hypertension: Secondary | ICD-10-CM | POA: Diagnosis present

## 2017-03-24 DIAGNOSIS — M25562 Pain in left knee: Secondary | ICD-10-CM | POA: Diagnosis present

## 2017-03-24 DIAGNOSIS — Z7982 Long term (current) use of aspirin: Secondary | ICD-10-CM | POA: Diagnosis not present

## 2017-03-24 DIAGNOSIS — Z87891 Personal history of nicotine dependence: Secondary | ICD-10-CM | POA: Diagnosis not present

## 2017-03-24 DIAGNOSIS — Y792 Prosthetic and other implants, materials and accessory orthopedic devices associated with adverse incidents: Secondary | ICD-10-CM | POA: Diagnosis present

## 2017-03-24 DIAGNOSIS — R339 Retention of urine, unspecified: Secondary | ICD-10-CM | POA: Diagnosis present

## 2017-03-24 DIAGNOSIS — T84023A Instability of internal left knee prosthesis, initial encounter: Secondary | ICD-10-CM | POA: Diagnosis present

## 2017-03-24 LAB — BPAM RBC
BLOOD PRODUCT EXPIRATION DATE: 201807152359
Blood Product Expiration Date: 201807152359
Unit Type and Rh: 5100
Unit Type and Rh: 5100

## 2017-03-24 LAB — CBC
HCT: 24.1 % — ABNORMAL LOW (ref 39.0–52.0)
Hemoglobin: 7.6 g/dL — ABNORMAL LOW (ref 13.0–17.0)
MCH: 26.1 pg (ref 26.0–34.0)
MCHC: 31.5 g/dL (ref 30.0–36.0)
MCV: 82.8 fL (ref 78.0–100.0)
PLATELETS: 364 10*3/uL (ref 150–400)
RBC: 2.91 MIL/uL — AB (ref 4.22–5.81)
RDW: 15.7 % — ABNORMAL HIGH (ref 11.5–15.5)
WBC: 9.4 10*3/uL (ref 4.0–10.5)

## 2017-03-24 LAB — BASIC METABOLIC PANEL
Anion gap: 8 (ref 5–15)
BUN: 20 mg/dL (ref 6–20)
CALCIUM: 7.4 mg/dL — AB (ref 8.9–10.3)
CO2: 24 mmol/L (ref 22–32)
CREATININE: 1.1 mg/dL (ref 0.61–1.24)
Chloride: 104 mmol/L (ref 101–111)
GFR calc Af Amer: >60 mL/min (ref >60)
Glucose, Bld: 100 mg/dL — ABNORMAL HIGH (ref 65–99)
POTASSIUM: 3.7 mmol/L (ref 3.5–5.1)
SODIUM: 136 mmol/L (ref 135–145)

## 2017-03-24 LAB — TYPE AND SCREEN
ABO/RH(D): O POS
Antibody Screen: NEGATIVE
UNIT DIVISION: 0
Unit division: 0

## 2017-03-24 LAB — PREPARE RBC (CROSSMATCH)

## 2017-03-24 MED ORDER — FUROSEMIDE 10 MG/ML IJ SOLN
20.0000 mg | Freq: Once | INTRAMUSCULAR | Status: AC
  Administered 2017-03-24: 20 mg via INTRAVENOUS
  Filled 2017-03-24: qty 2

## 2017-03-24 MED ORDER — SODIUM CHLORIDE 0.9 % IV SOLN
Freq: Once | INTRAVENOUS | Status: AC
  Administered 2017-03-24: 17:00:00 via INTRAVENOUS

## 2017-03-24 MED ORDER — SODIUM CHLORIDE 0.9 % IV SOLN
Freq: Once | INTRAVENOUS | Status: AC
  Administered 2017-03-24: 11:00:00 via INTRAVENOUS

## 2017-03-24 NOTE — Social Work (Signed)
CSW went to meet with patient has he was just DC on 6/15 due to Left Total Knee. Patient was readmitted today for Revision/repair of Leftt Total Knee.  CSW discussed with patient returning to SNF. [facility] Patient felt that rehabilitation was too rough on him and that is why he is back in hospital.  Patient indicated that he would like to go to [facility] [place]. CSW will send out offer to SNF and f/u.  Patient indicated that his son came to see him already and is aware that he is back in hospital.

## 2017-03-24 NOTE — Care Management Obs Status (Signed)
MEDICARE OBSERVATION STATUS NOTIFICATION   Patient Details  Name: Gerald Lewis MRN: [id number] Date of Birth: May 17, 1935   Medicare Observation Status Notification Given:  Yes    Lawerance Sabalebbie Dangela How, RN 03/24/2017, 10:50 AM

## 2017-03-24 NOTE — Progress Notes (Signed)
Physical Therapy Treatment Patient Details Name: Gerald Lewis MRN: [id number] DOB: 1935/03/08 Today's Date: 03/24/2017    History of Present Illness Gerald Lewis is an 82 year old gentleman who had a left total knee replacement with Dr. Jola Lewis days ago.   He had a lot of trouble afterwards with hamstring spasm and wanted to keep his knee bent.  He was sent in from the nursing home today because of worsening pain and inability to extend the knee.. Pt underwent L TKA revision on 6/18. Pt was at [facility].    PT Comments    Pt performed supine exercises with better ability to participate and follow commands.  Pt performed transfer from bed to chair with max assist +1 to boost into standing and rotate to recliner chair.  Positioned patient in recliner chair with ice to L knee.  Rn in post tx for starting 1st unit of blood.  Pt remains to require SNF placement at d/c to improve strength and functional mobility before returning to private residence.     Follow Up Recommendations  SNF     Equipment Recommendations  Other (comment) (TBD)    Recommendations for Other Services       Precautions / Restrictions Precautions Precautions: Fall;Knee Precaution Comments: pt lethargic with difficulty following commands Restrictions Weight Bearing Restrictions: Yes LLE Weight Bearing: Weight bearing as tolerated    Mobility  Bed Mobility Overal bed mobility: Needs Assistance Bed Mobility: Supine to Sit     Supine to sit: Max assist;Total assist;HOB elevated     General bed mobility comments: Pt required assistance to scoot and advance LEs to edge of bed.  Assist also required for trunk elevation into seated position edge of bed.    Transfers Overall transfer level: Needs assistance Equipment used: None Transfers: Stand Pivot Transfers;Sit to/from Stand Sit to Stand: Max assist;+2 safety/equipment Stand pivot transfers: Max assist;+2 safety/equipment       General transfer comment: Pt  performed sit to stand holding to PTA shoulders.  This allowed for patient to commit to forward weight shifting.  Pt with strong grip on rails and unable to forwadr weight shift pushing from bed.  Once in standing.  pt able to turn and sit with assist from PTA to rotate to the right.  Performed transfer to strong side.    Ambulation/Gait Ambulation/Gait assistance:  (Pt unable at this time remains anxious and requiring significant assistance to attempt to stand.  )               Stairs            Wheelchair Mobility    Modified Rankin (Stroke Patients Only)       Balance Overall balance assessment: Needs assistance Sitting-balance support: Feet supported;Bilateral upper extremity supported;No upper extremity supported Sitting balance-Leahy Scale: Poor       Standing balance-Leahy Scale: Zero                              Cognition Arousal/Alertness: Lethargic;Suspect due to medications Behavior During Therapy: Gerald Lewis for tasks assessed/performed Overall Cognitive Status: Difficult to assess                                 General Comments: pt lethargic but did follow simple commands depsite eyes closed      Exercises Total Joint Exercises Ankle Circles/Pumps: AROM;Left;10 reps;Supine Quad Sets: AROM;Left;10 reps;Supine  Towel Squeeze: AROM;Both;10 reps;Supine Heel Slides: AAROM;Left;10 reps;Supine Hip ABduction/ADduction: AAROM;10 reps;Supine;Left Straight Leg Raises: AAROM;Left;10 reps;Supine Goniometric ROM: 18-60 degrees L knee    General Comments        Pertinent Vitals/Pain Pain Assessment: 0-10 Pain Score: 7  Pain Location: L knee Pain Descriptors / Indicators: Constant;Discomfort;Grimacing;Guarding;Operative site guarding;Sharp;Shooting Pain Intervention(s): Monitored during session;Repositioned;Ice applied    Home Living                      Prior Function            PT Goals (current goals can now be found  in the care plan section) Acute Rehab PT Goals Patient Stated Goal: Stop pain Potential to Achieve Goals: Fair Progress towards PT goals: Progressing toward goals    Frequency    7X/week      PT Plan Current plan remains appropriate    Co-evaluation              AM-PAC PT "6 Clicks" Daily Activity  Outcome Measure  Difficulty turning over in bed (including adjusting bedclothes, sheets and blankets)?: Total Difficulty moving from lying on back to sitting on the side of the bed? : Total Difficulty sitting down on and standing up from a chair with arms (e.g., wheelchair, bedside commode, etc,.)?: Total Help needed moving to and from a bed to chair (including a wheelchair)?: A Lot Help needed walking in hospital room?: Total Help needed climbing 3-5 steps with a railing? : Total 6 Click Score: 7    End of Session Equipment Utilized During Treatment: Gait belt Activity Tolerance: Patient limited by pain Patient left: in bed;with call bell/phone within reach Nurse Communication: Mobility status PT Visit Diagnosis: Repeated falls (R29.6);Difficulty in walking, not elsewhere classified (R26.2);Pain Pain - Right/Left: Left Pain - part of body: Knee;Leg     Time: 1610-96041305-1343 PT Time Calculation (min) (ACUTE ONLY): 38 min  Charges:  $Therapeutic Exercise: 8-22 mins $Therapeutic Activity: 23-37 mins                    G CodesJoycelyn Lewis:       Gerald Lewis, PTA pager [phone]    Gerald Lewis 03/24/2017, 1:54 PM

## 2017-03-24 NOTE — Progress Notes (Signed)
Subjective: 1 Day Post-Op Procedure(s) (LRB): ATTEMPTED CLOSED REDUCTION TOTAL KNEE  INJECTION RIGHT KNEE  TOTAL KNEE REVISION LEFT KNEE (Left)  Patient alert in bed eating breakfast this morning. He has a friend at bedside helping him today. He is in knee immobilizer at only about 5-10 degrees of flexion. He feels a little tired and fatigued. But overall better.   Activity level:  wbat Diet tolerance:  ok Voiding:  ok Patient reports pain as mild and moderate.    Objective: Vital signs in last 24 hours: Temp:  [97.3 F (36.3 C)-99.5 F (37.5 C)] 97.7 F (36.5 C) (06/19 0520) Pulse Rate:  [73-98] 92 (06/19 0520) Resp:  [10-28] 20 (06/19 0520) BP: (108-143)/(48-68) 139/57 (06/19 0520) SpO2:  [92 %-98 %] 95 % (06/19 0520)  Labs:  Recent Labs  03/22/17 0949 03/24/17 0346  HGB 9.7* 7.6*    Recent Labs  03/22/17 0949 03/24/17 0346  WBC 17.1* 9.4  RBC 3.55* 2.91*  HCT 28.5* 24.1*  PLT 440* 364    Recent Labs  03/22/17 0949 03/24/17 0346  NA 136 136  K 3.7 3.7  CL 102 104  CO2 24 24  BUN 27* 20  CREATININE 1.11 1.10  GLUCOSE 92 100*  CALCIUM 8.2* 7.4*   No results for input(s): LABPT, INR in the last 72 hours.  Physical Exam:  Neurologically intact ABD soft Neurovascular intact Sensation intact distally Intact pulses distally Dorsiflexion/Plantar flexion intact Incision: dressing C/D/I and no drainage No cellulitis present Compartment soft  Assessment/Plan:  1 Day Post-Op Procedure(s) (LRB): ATTEMPTED CLOSED REDUCTION TOTAL KNEE  INJECTION RIGHT KNEE  TOTAL KNEE REVISION LEFT KNEE (Left) Advance diet Up with therapy Discharge to SNF once cleared by PT and doing well. We will give 2 units of blood today as Hgb is 7.6 and he is symptomatic. No CPM. We will stop antibiotics as he has no white count and his cultures show no growth. He will be WBAT as tolerated with PT and will wean out of immobilizer. If he begins cramping again we will put him back  in immobilizer. We will continue to follow up closely and try to limit pain meds to help eliminate any confusion.   Isidra Mings, Ginger OrganNDREW PAUL 03/24/2017, 8:44 AM

## 2017-03-24 NOTE — Evaluation (Signed)
Occupational Therapy Evaluation Patient Details Name: Gerald Lewis MRN: [id number] DOB: Oct 07, 1934 Today's Date: 03/24/2017    History of Present Illness [name] is an 82 year old gentleman who had a left total knee replacement with Dr. Jerl Santos 5 days ago.   He had a lot of trouble afterwards with hamstring spasm and wanted to keep his knee bent.  He was sent in from the nursing home because of worsening pain and inability to extend the knee. Pt underwent L TKA revision on 6/18. Pt was at [facility].   Clinical Impression   Pt presents with significant L knee pain and generalized weakness impacting his ability to participate in ADL at this time. He was recently discharged to SNF and was participating in rehabilitation there s/p L TKA. Prior to previous admission, pt had been independent with simple ADL but was reliant on SNF staff since previous discharge. Pt currently requires max +2 assist for LB ADL and toilet transfers and min assist for UB ADL. He would benefit from continued OT services while admitted in order to improve independence with ADL and functional mobility. He would best benefit from SNF placement for continued rehabilitation post-acute D/C. OT will continue to follow while admitted.     Follow Up Recommendations  SNF;Supervision/Assistance - 24 hour    Equipment Recommendations  Other (comment) (TBD at next venue of care)    Recommendations for Other Services       Precautions / Restrictions Precautions Precautions: Fall;Knee Precaution Comments: Pt with lethargy at times this session but was able to follow commands.  Restrictions Weight Bearing Restrictions: Yes LLE Weight Bearing: Weight bearing as tolerated      Mobility Bed Mobility Overal bed mobility: Needs Assistance Bed Mobility: Sit to Supine       Sit to supine: Max assist   General bed mobility comments: Max assist for return to supine from sitting at EOB with VC's for sequencing.    Transfers Overall transfer level: Needs assistance Equipment used: None Transfers: Stand Pivot Transfers;Sit to/from Stand Sit to Stand: Max assist;+2 safety/equipment Stand pivot transfers: Max assist;+2 safety/equipment       General transfer comment: During simulated toilet transfer. Initially attempting to utilize [facility] with max assist of 1. However, pt unable to obtain upright position and requiring max lifting assist of 2 to stand with Stedy. Pt with difficulty sequencing task.     Balance Overall balance assessment: Needs assistance Sitting-balance support: Feet supported;Bilateral upper extremity supported Sitting balance-Leahy Scale: Poor   Postural control: Posterior lean Standing balance support: Bilateral upper extremity supported;During functional activity;No upper extremity supported Standing balance-Leahy Scale: Zero Standing balance comment: Max +2 assist to achieve standing position.                            ADL either performed or assessed with clinical judgement   ADL Overall ADL's : Needs assistance/impaired Eating/Feeding: Set up;Sitting (with back support)   Grooming: Set up;Bed level   Upper Body Bathing: Minimal assistance;Sitting   Lower Body Bathing: Maximal assistance;Sit to/from stand;+2 for physical assistance;+2 for safety/equipment   Upper Body Dressing : Minimal assistance;Sitting   Lower Body Dressing: Maximal assistance;+2 for physical assistance;+2 for safety/equipment;Sit to/from stand   Toilet Transfer: Maximal assistance;+2 for physical assistance;+2 for safety/equipment Toilet Transfer Details (indicate cue type and reason): Simulated with use of Stedy for transfer from chair to bed. Requiring max assist +2 and difficulty sequencing foot placement.  Toileting- Architect  and Hygiene: Maximal assistance;Sitting/lateral lean       Functional mobility during ADLs: Maximal assistance;+2 for physical  assistance;+2 for safety/equipment (with Stedy only) General ADL Comments: Pt perseverating throughout session and with significant difficulty relaxing L LE during session due to pain causing significant L knee flexion.      Vision   Vision Assessment?: No apparent visual deficits     Perception     Praxis      Pertinent Vitals/Pain Pain Assessment: Faces Faces Pain Scale: Hurts whole lot Pain Location: L knee Pain Descriptors / Indicators: Constant;Discomfort;Grimacing;Guarding;Operative site guarding;Sharp;Shooting Pain Intervention(s): Monitored during session;Premedicated before session;Repositioned;Ice applied     Hand Dominance Right   Extremity/Trunk Assessment Upper Extremity Assessment Upper Extremity Assessment: Generalized weakness   Lower Extremity Assessment Lower Extremity Assessment: Defer to PT evaluation   Cervical / Trunk Assessment Cervical / Trunk Assessment: Kyphotic   Communication Communication Communication: HOH   Cognition Arousal/Alertness: Lethargic;Suspect due to medications;Awake/alert Behavior During Therapy: WFL for tasks assessed/performed Overall Cognitive Status: No family/caregiver present to determine baseline cognitive functioning                                 General Comments: Noted difficulty sequencing with use of Stedy with pt unable to properly align feet with VC's. Pt able to follow commands this session and was alert until the end of session when becoming lethargic and closing eyes. Perseveration noted with repetition of stories.    General Comments       Exercises     Shoulder Instructions      Home Living Family/patient expects to be discharged to:: Skilled nursing facility Living Arrangements: Alone                               Additional Comments: [facility]      Prior Functioning/Environment Level of Independence: Needs assistance  Gait / Transfers Assistance Needed:  Unable to ambulate due to L knee flexion contracture and pain.  ADL's / Homemaking Assistance Needed: Dependent on SNF staff prior to current admission. Per previous admission, pt was able to complete simple ADL prior to initial surgery.             OT Problem List: Decreased strength;Decreased range of motion;Decreased activity tolerance;Impaired balance (sitting and/or standing);Decreased cognition;Decreased safety awareness;Decreased knowledge of use of DME or AE;Decreased knowledge of precautions;Pain      OT Treatment/Interventions: Self-care/ADL training;Therapeutic exercise;Energy conservation;DME and/or AE instruction;Therapeutic activities;Patient/family education;Balance training    OT Goals(Current goals can be found in the care plan section) Acute Rehab OT Goals Patient Stated Goal: Stop pain OT Goal Formulation: With patient Time For Goal Achievement: 04/07/17 Potential to Achieve Goals: Good ADL Goals Pt Will Perform Grooming: with min assist;standing Pt Will Perform Lower Body Bathing: with min assist;sit to/from stand;with adaptive equipment (with or without AE) Pt Will Perform Lower Body Dressing: with min assist;with adaptive equipment;sit to/from stand (with or without AE) Pt Will Transfer to Toilet: with min assist;ambulating;bedside commode (BSC over toilet) Pt Will Perform Toileting - Clothing Manipulation and hygiene: with min assist;sit to/from stand Additional ADL Goal #1: Pt will incorporate 2 strategies to conserve energy into his morning ADL routine with no more than 1 verbal cue.  OT Frequency: Min 2X/week   Barriers to D/C: Decreased caregiver support  Co-evaluation              AM-PAC PT "6 Clicks" Daily Activity     Outcome Measure Help from another person eating meals?: A Little Help from another person taking care of personal grooming?: A Little Help from another person toileting, which includes using toliet, bedpan, or urinal?: A  Lot Help from another person bathing (including washing, rinsing, drying)?: A Lot Help from another person to put on and taking off regular upper body clothing?: A Little Help from another person to put on and taking off regular lower body clothing?: Total 6 Click Score: 14   End of Session Equipment Utilized During Treatment: Gait belt Antony Salmon) Nurse Communication: Mobility status  Activity Tolerance: Patient limited by pain Patient left: in bed;with call bell/phone within reach;with nursing/sitter in room (deferred replacement of SCD's to RN)  OT Visit Diagnosis: Other abnormalities of gait and mobility (R26.89);Unsteadiness on feet (R26.81);Pain;Muscle weakness (generalized) (M62.81) Pain - Right/Left: Left Pain - part of body: Knee                Time: 1610-9604 OT Time Calculation (min): 41 min Charges:  OT General Charges $OT Visit: 1 Procedure OT Evaluation $OT Eval Moderate Complexity: 1 Procedure OT Treatments $Self Care/Home Management : 23-37 mins G-Codes:     Doristine Section, MS OTR/L  Pager: [phone]   Bhavya Eschete A Lore Polka 03/24/2017, 6:07 PM

## 2017-03-24 NOTE — Progress Notes (Signed)
Per order written 03/23/17 1619, foley catheter placed for urinary retention after needing to be in-and-out catheterized three times. Straight cath charted x3: at 03/23/17 1525, 03/24/17 0500, and 03/24/17 1431. Foley placed 1828 after patient was unable to void while feeling the urge to void and attempting to use urinal with assistance. Over 900mL clear yellow urine returned immediately.

## 2017-03-25 LAB — TYPE AND SCREEN
ABO/RH(D): O POS
Antibody Screen: NEGATIVE
UNIT DIVISION: 0
UNIT DIVISION: 0

## 2017-03-25 LAB — BPAM RBC
Blood Product Expiration Date: 201807152359
Blood Product Expiration Date: 201807152359
ISSUE DATE / TIME: 201806191309
ISSUE DATE / TIME: 201806191645
Unit Type and Rh: 5100
Unit Type and Rh: 5100

## 2017-03-25 LAB — CBC
HEMATOCRIT: 28.4 % — AB (ref 39.0–52.0)
HEMOGLOBIN: 9.3 g/dL — AB (ref 13.0–17.0)
MCH: 27.3 pg (ref 26.0–34.0)
MCHC: 32.7 g/dL (ref 30.0–36.0)
MCV: 83.3 fL (ref 78.0–100.0)
Platelets: 345 10*3/uL (ref 150–400)
RBC: 3.41 MIL/uL — ABNORMAL LOW (ref 4.22–5.81)
RDW: 15.6 % — ABNORMAL HIGH (ref 11.5–15.5)
WBC: 10 10*3/uL (ref 4.0–10.5)

## 2017-03-25 MED ORDER — TAMSULOSIN HCL 0.4 MG PO CAPS
0.4000 mg | ORAL_CAPSULE | Freq: Every day | ORAL | Status: DC
  Administered 2017-03-25 – 2017-03-27 (×3): 0.4 mg via ORAL
  Filled 2017-03-25 (×3): qty 1

## 2017-03-25 NOTE — Progress Notes (Signed)
Physical Therapy Treatment Patient Details Name: Gerald Lewis MRN: [id number] DOB: Feb 21, 1935 Today's Date: 03/25/2017    History of Present Illness Gerald Lewis is an 82 year old gentleman who had a left total knee replacement with Dr. Jerl Santos 5 days ago.   He had a lot of trouble afterwards with hamstring spasm and wanted to keep his knee bent.  He was sent in from the nursing home because of worsening pain and inability to extend the knee. Pt underwent L TKA revision on 6/18. Pt was at [facility].    PT Comments    Pt becomes more fatigued in the pm and required max to total assist throughout.  Pt only able to ambulate 4 feet before requiring recliner chair.  Pt with increasing redness on L knee and informed nursing. CP placed on L knee for pain and LLE elevate with knee in resting extension.  Pt remains to require skilled rehab in a skilled nursing facility to improve strength, function and reduce pain.  Pt resting in chair comfortably post tx.     Follow Up Recommendations  SNF     Equipment Recommendations  Other (comment) (TBD)    Recommendations for Other Services       Precautions / Restrictions Precautions Precautions: Fall;Knee Precaution Comments: Reviewed keeping knee straight Restrictions Weight Bearing Restrictions: Yes LLE Weight Bearing: Weight bearing as tolerated    Mobility  Bed Mobility Overal bed mobility: Needs Assistance Bed Mobility: Supine to Sit     Supine to sit: Max assist;HOB elevated     General bed mobility comments: Pt more resistant to mobility this session and require assist with B LEs to edge of bed and increased assistance to elevate trunk into sitting.    Transfers Overall transfer level: Needs assistance Equipment used: Rolling walker (2 wheeled) Transfers: Sit to/from Stand Sit to Stand: Max assist Stand pivot transfers: Max assist       General transfer comment: Pt remains to require max assistance with sit to stand transfer.   Posterior LOB noted and poor ability to transfer hands from bed to RW.    Ambulation/Gait Ambulation/Gait assistance: Max assist Ambulation Distance (Feet): 6 Feet (Decreased gait distance as patient presents with increased fatigue during session.  ) Assistive device: Rolling walker (2 wheeled) Gait Pattern/deviations: Step-to pattern;Antalgic;Trunk flexed;Decreased stride length Gait velocity: slow Gait velocity interpretation: Below normal speed for age/gender General Gait Details: Pt required increased time and presents with shorten gait distance due to fatigue and pain.  Pt required frequent cueing for upper trunk control and advancing steps forward into RW.  Pt begins to flex more as gait progressed and ultimatley required assist to bring patient his recliner chair as he began to flex hips into a seated position.     Stairs            Wheelchair Mobility    Modified Rankin (Stroke Patients Only)       Balance Overall balance assessment: Needs assistance Sitting-balance support: Feet supported;Bilateral upper extremity supported Sitting balance-Leahy Scale: Poor       Standing balance-Leahy Scale: Zero Standing balance comment: Max assist at all time for safety.                              Cognition Arousal/Alertness: Awake/alert Behavior During Therapy: WFL for tasks assessed/performed Overall Cognitive Status: Impaired/Different from baseline Area of Impairment: Awareness;Problem solving;Following commands;Safety/judgement;Memory  Memory: Decreased short-term memory Following Commands: Follows one step commands with increased time;Follows one step commands inconsistently Safety/Judgement: Decreased awareness of safety;Decreased awareness of deficits   Problem Solving: Slow processing General Comments: Pt with STM deficits.  Pt more alert during session.        Exercises Total Joint Exercises Ankle Circles/Pumps:   (deferred ther ex this pm due to pain.  )    General Comments        Pertinent Vitals/Pain Pain Assessment: Faces Pain Score: 7  Faces Pain Scale: Hurts whole lot Pain Location: L knee Pain Descriptors / Indicators: Constant;Discomfort;Grimacing;Guarding;Operative site guarding;Sharp;Shooting Pain Intervention(s): Monitored during session;Repositioned;Ice applied    Home Living                      Prior Function            PT Goals (current goals can now be found in the care plan section) Acute Rehab PT Goals Patient Stated Goal: Stop pain Potential to Achieve Goals: Fair Progress towards PT goals: Progressing toward goals    Frequency    7X/week      PT Plan Current plan remains appropriate    Co-evaluation              AM-PAC PT "6 Clicks" Daily Activity  Outcome Measure  Difficulty turning over in bed (including adjusting bedclothes, sheets and blankets)?: Total Difficulty moving from lying on back to sitting on the side of the bed? : Total Difficulty sitting down on and standing up from a chair with arms (e.g., wheelchair, bedside commode, etc,.)?: Total Help needed moving to and from a bed to chair (including a wheelchair)?: A Lot Help needed walking in hospital room?: A Lot Help needed climbing 3-5 steps with a railing? : Total 6 Click Score: 8    End of Session Equipment Utilized During Treatment: Gait belt Activity Tolerance: Patient limited by pain Patient left: in bed;with call bell/phone within reach Nurse Communication: Mobility status PT Visit Diagnosis: Repeated falls (R29.6);Difficulty in walking, not elsewhere classified (R26.2);Pain Pain - Right/Left: Left Pain - part of body: Knee;Leg     Time: [id number] PT Time Calculation (min) (ACUTE ONLY): 25 min  Charges:  $Gait Training: 8-22 mins $Therapeutic Activity: 8-22 mins                    G Codes:       Joycelyn Ruaimee Bruno Leach, PTA pager [phone]    Florestine Aversimee J  Matalynn Graff 03/25/2017, 4:32 PM

## 2017-03-25 NOTE — Progress Notes (Signed)
Physical Therapy Treatment Patient Details Name: Gerald Lewis MRN: [id number] DOB: 04/01/1935 Today's Date: 03/25/2017    History of Present Illness Gerald Lewis is an 82 year old gentleman who had a left total knee replacement with Dr. Jerl Santosalldorf 5 days ago.   He had a lot of trouble afterwards with hamstring spasm and wanted to keep his knee bent.  He was sent in from the nursing home because of worsening pain and inability to extend the knee. Pt underwent L TKA revision on 6/18. [facility].    PT Comments    Pt performed increased gait and functional mobility during session.  Pt initially presents with posterior lean but as gait and standing progresses he is able to maintain a more upright posture.  Pt continues to require significant assistance and will benefit from skilled rehab at SNF for short term rehab.  Pt please with his progress today and reports decreased pain.      Follow Up Recommendations  SNF     Equipment Recommendations  Other (comment) (TBD)    Recommendations for Other Services       Precautions / Restrictions Precautions Precautions: Fall;Knee Precaution Comments: Reviewed keeping knee straight Restrictions Weight Bearing Restrictions: Yes LLE Weight Bearing: Weight bearing as tolerated    Mobility  Bed Mobility Overal bed mobility: Needs Assistance Bed Mobility: Supine to Sit     Supine to sit: Max assist;HOB elevated     General bed mobility comments: Pt with better ability to follow commands and advance to edge of bed.  Pt followed commands for LE progression with assistance and scooting hips forward.    Transfers Overall transfer level: Needs assistance Equipment used: Rolling walker (2 wheeled) Transfers: Sit to/from [employer] CorporationStand;Stand Pivot Transfers Sit to Stand: Max assist Stand pivot transfers: Max assist       General transfer comment: Pt remains to require cues for hand placement Pt with better ability to follow commands and push from  seated surface and reach back for seated surface when returning to recliner.    Ambulation/Gait Ambulation/Gait assistance: Mod assist;Max assist Ambulation Distance (Feet): 20 Feet Assistive device: Rolling walker (2 wheeled) Gait Pattern/deviations: Step-to pattern;Antalgic;Trunk flexed;Decreased stride length Gait velocity: slow Gait velocity interpretation: Below normal speed for age/gender General Gait Details: Pt required cues for sequencing and RW position.  Initially require max assist with turns but after turn patient required mod assistance for gait training.     Stairs            Wheelchair Mobility    Modified Rankin (Stroke Patients Only)       Balance Overall balance assessment: Needs assistance Sitting-balance support: Feet supported;Bilateral upper extremity supported Sitting balance-Leahy Scale: Poor       Standing balance-Leahy Scale: Zero                              Cognition Arousal/Alertness: Lethargic;Suspect due to medications;Awake/alert Behavior During Therapy: WFL for tasks assessed/performed Overall Cognitive Status: No family/caregiver present to determine baseline cognitive functioning                                 General Comments: Pt with STM deficits.  Pt more alert during session.        Exercises Total Joint Exercises Ankle Circles/Pumps: AROM;Left;10 reps;Supine Quad Sets: AROM;Left;10 reps;Supine Heel Slides: AAROM;Left;10 reps;Supine Hip ABduction/ADduction: AAROM;10 reps;Supine;Left Straight Leg  Raises: AAROM;Left;10 reps;Supine Goniometric ROM: 18-70 degrees L knee flexion.      General Comments        Pertinent Vitals/Pain Pain Assessment: Faces Faces Pain Scale: Hurts whole lot Pain Location: L knee Pain Descriptors / Indicators: Constant;Discomfort;Grimacing;Guarding;Operative site guarding;Sharp;Shooting Pain Intervention(s): Monitored during session;Repositioned;Ice applied     Home Living                      Prior Function            PT Goals (current goals can now be found in the care plan section) Acute Rehab PT Goals Patient Stated Goal: Stop pain Potential to Achieve Goals: Fair Progress towards PT goals: Progressing toward goals    Frequency    7X/week      PT Plan Current plan remains appropriate    Co-evaluation              AM-PAC PT "6 Clicks" Daily Activity  Outcome Measure  Difficulty turning over in bed (including adjusting bedclothes, sheets and blankets)?: Total Difficulty moving from lying on back to sitting on the side of the bed? : Total Difficulty sitting down on and standing up from a chair with arms (e.g., wheelchair, bedside commode, etc,.)?: Total Help needed moving to and from a bed to chair (including a wheelchair)?: A Lot Help needed walking in hospital room?: A Lot Help needed climbing 3-5 steps with a railing? : Total 6 Click Score: 8    End of Session Equipment Utilized During Treatment: Gait belt Activity Tolerance: Patient limited by pain Patient left: in bed;with call bell/phone within reach Nurse Communication: Mobility status PT Visit Diagnosis: Repeated falls (R29.6);Difficulty in walking, not elsewhere classified (R26.2);Pain Pain - Right/Left: Left Pain - part of body: Knee;Leg     Time: 1610-9604 PT Time Calculation (min) (ACUTE ONLY): 42 min  Charges:  $Gait Training: 8-22 mins $Therapeutic Exercise: 8-22 mins $Therapeutic Activity: 8-22 mins                    G Codes:       Joycelyn Rua, PTA pager [phone]    Florestine Avers 03/25/2017, 10:51 AM

## 2017-03-25 NOTE — Progress Notes (Signed)
Subjective: 2 Days Post-Op Procedure(s) (LRB): ATTEMPTED CLOSED REDUCTION TOTAL KNEE  INJECTION RIGHT KNEE  TOTAL KNEE REVISION LEFT KNEE (Left)   Patient resting in bed. He is apprehensive about getting up and moving due to pain. He also had a catheter placed last night due to urinary retention. Patient states that he didn't tell us but he has had problems urinating for the last few weeks. He is hoping to go to [facility] in [place] as he has been there before due to significant knee pain which he also did not tell us about pre op.   Activity level:  wbat Diet tolerance:  ok Voiding:  Foley in place Patient reports pain as moderate.    Objective: Vital signs in last 24 hours: Temp:  [97.4 F (36.3 C)-98.3 F (36.8 C)] 97.5 F (36.4 C) (06/20 0630) Pulse Rate:  [80-94] 80 (06/20 0630) Resp:  [16-20] 17 (06/20 0630) BP: (119-163)/(46-71) 163/59 (06/20 0630) SpO2:  [90 %-98 %] 94 % (06/20 0630)  Labs:  Recent Labs  03/22/17 0949 03/24/17 0346 03/25/17 0514  HGB 9.7* 7.6* 9.3*    Recent Labs  03/24/17 0346 03/25/17 0514  WBC 9.4 10.0  RBC 2.91* 3.41*  HCT 24.1* 28.4*  PLT 364 345    Recent Labs  03/22/17 0949 03/24/17 0346  NA 136 136  K 3.7 3.7  CL 102 104  CO2 24 24  BUN 27* 20  CREATININE 1.11 1.10  GLUCOSE 92 100*  CALCIUM 8.2* 7.4*   No results for input(s): LABPT, INR in the last 72 hours.  Physical Exam:  Neurologically intact ABD soft Neurovascular intact Sensation intact distally Intact pulses distally Dorsiflexion/Plantar flexion intact Incision: dressing C/D/I and no drainage No cellulitis present Compartment soft  Assessment/Plan:  2 Days Post-Op Procedure(s) (LRB): ATTEMPTED CLOSED REDUCTION TOTAL KNEE  INJECTION RIGHT KNEE  TOTAL KNEE REVISION LEFT KNEE (Left) Advance diet Up with therapy Discharge to SNF maybe tomorrow if he is urinating on his own and is progressing some with PT. I have encouraged him to push through the  pain as he really needs to try to ambulate. He does feel a little better after receiving blood yesterday.  He is still having cramping and pain but we are trying to limit meds due to confusion Continue on ASA 325mg  BID x 2 weeks post op. I have started him on Flomax to help with urinary retention. We will take out the foley this morning to see if he can go on his own.  We will continue to monitor him closely.  Gerald Lewis, Gerald Lewis 03/25/2017, 8:31 AM

## 2017-03-26 LAB — CBC
HCT: 29 % — ABNORMAL LOW (ref 39.0–52.0)
Hemoglobin: 9.2 g/dL — ABNORMAL LOW (ref 13.0–17.0)
MCH: 26.4 pg (ref 26.0–34.0)
MCHC: 31.7 g/dL (ref 30.0–36.0)
MCV: 83.1 fL (ref 78.0–100.0)
PLATELETS: 427 10*3/uL — AB (ref 150–400)
RBC: 3.49 MIL/uL — ABNORMAL LOW (ref 4.22–5.81)
RDW: 15.6 % — AB (ref 11.5–15.5)
WBC: 9.7 10*3/uL (ref 4.0–10.5)

## 2017-03-26 LAB — BODY FLUID CULTURE
Culture: NO GROWTH
Gram Stain: NONE SEEN
Special Requests: NORMAL

## 2017-03-26 MED ORDER — OXYCODONE-ACETAMINOPHEN 5-325 MG PO TABS
1.0000 | ORAL_TABLET | Freq: Once | ORAL | Status: AC
  Administered 2017-03-26: 1 via ORAL
  Filled 2017-03-26: qty 1

## 2017-03-26 MED ORDER — BISACODYL 10 MG RE SUPP
10.0000 mg | Freq: Once | RECTAL | Status: AC
  Administered 2017-03-26: 10 mg via RECTAL
  Filled 2017-03-26: qty 1

## 2017-03-26 MED ORDER — CEPHALEXIN 500 MG PO CAPS
500.0000 mg | ORAL_CAPSULE | Freq: Every day | ORAL | Status: DC
  Administered 2017-03-26: 500 mg via ORAL
  Filled 2017-03-26: qty 1

## 2017-03-26 NOTE — Progress Notes (Signed)
Physical Therapy Treatment Patient Details Name: Gerald Lewis MRN: [id number] DOB: Feb 16, 1935 Today's Date: 03/26/2017    History of Present Illness Gerald Lewis is an 82 year old gentleman who had a left total knee replacement with Dr. Jerl Santos 5 days ago.   He had a lot of trouble afterwards with hamstring spasm and wanted to keep his knee bent.  He was sent in from the nursing home because of worsening pain and inability to extend the knee. Pt underwent L TKA revision on 6/18. Pt was at [facility].    PT Comments    Pt performed decreased gait as he is concerned he has not had a BM.  Pt performed transfer to [facility] but only able to produce flatulence.  Pt appears fatigued and returned to bed after short gait trial .  Continue to recommend SNF at d/c to improve strength and functional mobility.     Follow Up Recommendations  SNF     Equipment Recommendations  Other (comment) (TBD)    Recommendations for Other Services       Precautions / Restrictions Precautions Precautions: Fall;Knee Precaution Comments: Reviewed keeping knee straight Restrictions Weight Bearing Restrictions: Yes LLE Weight Bearing: Weight bearing as tolerated    Mobility  Bed Mobility Overal bed mobility: Needs Assistance Bed Mobility: Sit to Supine     Sit to supine: Max assist   General bed mobility comments: Pt required assist to lift B LEs into bed against gravity.  Bed placed in trendelenberg and patient able to use rails to boost to [facility].    Transfers Overall transfer level: Needs assistance Equipment used: Rolling walker (2 wheeled) Transfers: Sit to/from Stand Sit to Stand: Max assist         General transfer comment: Cues for hand placement, forward weight shifting, Patient with better technique from a chair with arm rests.  Pt performed transfer from recliner and commode.    Ambulation/Gait Ambulation/Gait assistance: Max assist Ambulation Distance (Feet): 15 Feet Assistive device: Rolling  walker (2 wheeled) Gait Pattern/deviations: Step-to pattern;Antalgic;Trunk flexed;Decreased stride length;Shuffle Gait velocity: slow Gait velocity interpretation: Below normal speed for age/gender General Gait Details: Pt performed gait without KI donned.  Pt required constant cues for upright position and hip extension.  Pt required x3 rest breaks due to fatigue with cues to keep his hands on the RW and avoid reaching for the counters.     Stairs            Wheelchair Mobility    Modified Rankin (Stroke Patients Only)       Balance Overall balance assessment: Needs assistance Sitting-balance support: Feet supported;Bilateral upper extremity supported Sitting balance-Leahy Scale: Poor       Standing balance-Leahy Scale: Zero Standing balance comment: Max assist at all time for safety.                              Cognition Arousal/Alertness: Awake/alert Behavior During Therapy: WFL for tasks assessed/performed Overall Cognitive Status: Impaired/Different from baseline Area of Impairment: Awareness;Problem solving;Following commands;Safety/judgement;Memory                     Memory: Decreased short-term memory Following Commands: Follows one step commands with increased time;Follows one step commands inconsistently Safety/Judgement: Decreased awareness of safety;Decreased awareness of deficits   Problem Solving: Slow processing General Comments: Pt with STM deficits.  Pt more alert during session.        Exercises  General Comments        Pertinent Vitals/Pain Pain Assessment: Faces Pain Score: 8  Pain Location: L knee Pain Descriptors / Indicators: Constant;Discomfort;Grimacing;Guarding;Operative site guarding;Sharp;Shooting Pain Intervention(s): Monitored during session;Repositioned;Ice applied    Home Living                      Prior Function            PT Goals (current goals can now be found in the care plan  section) Acute Rehab PT Goals Patient Stated Goal: Stop pain Potential to Achieve Goals: Fair Progress towards PT goals: Progressing toward goals    Frequency    7X/week      PT Plan Current plan remains appropriate    Co-evaluation              AM-PAC PT "6 Clicks" Daily Activity  Outcome Measure  Difficulty turning over in bed (including adjusting bedclothes, sheets and blankets)?: Total Difficulty moving from lying on back to sitting on the side of the bed? : Total Difficulty sitting down on and standing up from a chair with arms (e.g., wheelchair, bedside commode, etc,.)?: Total Help needed moving to and from a bed to chair (including a wheelchair)?: A Lot Help needed walking in hospital room?: A Lot Help needed climbing 3-5 steps with a railing? : Total 6 Click Score: 8    End of Session Equipment Utilized During Treatment: Gait belt Activity Tolerance: Patient limited by pain Patient left: in bed;with call bell/phone within reach Nurse Communication: Mobility status PT Visit Diagnosis: Repeated falls (R29.6);Difficulty in walking, not elsewhere classified (R26.2);Pain Pain - Right/Left: Left Pain - part of body: Knee;Leg     Time: [id number] PT Time Calculation (min) (ACUTE ONLY): 36 min  Charges:  $Gait Training: 8-22 mins $Therapeutic Activity: 8-22 mins                    G Codes:       Joycelyn RuaAimee Ozro Russett, PTA pager [phone]    Florestine Aversimee J Wesleigh Markovic 03/26/2017, 5:19 PM

## 2017-03-26 NOTE — Progress Notes (Signed)
Subjective: 3 Days Post-Op Procedure(s) (LRB): ATTEMPTED CLOSED REDUCTION TOTAL KNEE  INJECTION RIGHT KNEE  TOTAL KNEE REVISION LEFT KNEE (Left)   Patient resting in bed. Still unable to urinate on flomax so a foley was placed. I have consulted urology.He has also not had a bowel movement in about 3 days. He does have a bed available today at [facility] in [place] per the CSW.   Activity level:  wbat Diet tolerance:  ok Voiding:  Foley in place Patient reports pain as mild and moderate.    Objective: Vital signs in last 24 hours: Temp:  [97.6 F (36.4 C)-98.9 F (37.2 C)] 97.6 F (36.4 C) (06/21 0545) Pulse Rate:  [69-85] 69 (06/21 0545) Resp:  [14-15] 15 (06/21 0545) BP: (163-170)/(70-72) 163/70 (06/21 0545) SpO2:  [95 %] 95 % (06/21 0545)  Labs:  Recent Labs  03/24/17 0346 03/25/17 0514 03/26/17 0607  HGB 7.6* 9.3* 9.2*    Recent Labs  03/25/17 0514 03/26/17 0607  WBC 10.0 9.7  RBC 3.41* 3.49*  HCT 28.4* 29.0*  PLT 345 427*    Recent Labs  03/24/17 0346  NA 136  K 3.7  CL 104  CO2 24  BUN 20  CREATININE 1.10  GLUCOSE 100*  CALCIUM 7.4*   No results for input(s): LABPT, INR in the last 72 hours.  Physical Exam:  Neurologically intact ABD soft Neurovascular intact Sensation intact distally Intact pulses distally Dorsiflexion/Plantar flexion intact Incision: dressing C/D/I and no drainage No cellulitis present Compartment soft  Assessment/Plan:  3 Days Post-Op Procedure(s) (LRB): ATTEMPTED CLOSED REDUCTION TOTAL KNEE  INJECTION RIGHT KNEE  TOTAL KNEE REVISION LEFT KNEE (Left) Advance diet Up with therapy Continue foley due to acute urinary retention Discharge to SNF  [facility] maybe today if he has a bowel movement. Suppository and enema ordered if necessary.  I spoke to urology and they recommended the Foley x 5 days. They also recommended continuing flomax along with adding 500mg  keflex at night while foley is in place. Follow up  in office 2 weeks post op. Continue on ASA 325mg  BID x 2 weeks post op.  Banesa Tristan, Ginger OrganNDREW PAUL 03/26/2017, 1:07 PM

## 2017-03-26 NOTE — Plan of Care (Signed)
Problem: Pain Managment: Goal: General experience of comfort will improve Outcome: Not Progressing Pt c/o constant pain to left knee which is swollen. Ice packs placed, immobilizer placed, repositioned and pain meds given. Will continue to assess.

## 2017-03-26 NOTE — Progress Notes (Signed)
On call physician for Dr. Jerl Santosalldorf paged regarding pt's BP of 180/71. Awaiting call back.

## 2017-03-26 NOTE — Progress Notes (Signed)
Physical Therapy Treatment Patient Details Name: Gerald Lewis MRN: [id number] DOB: 01-03-1935 Today's Date: 03/26/2017    History of Present Illness Gerald Lewis is an 82 year old gentleman who had a left total knee replacement with Dr. Jerl Santos 5 days ago.   He had a lot of trouble afterwards with hamstring spasm and wanted to keep his knee bent.  He was sent in from the nursing home because of worsening pain and inability to extend the knee. Pt underwent L TKA revision on 6/18. Pt was at [facility].    PT Comments    Pt performed increased gait during session.  Pt required increased assistance as he remains fearful to mobilize.  Pt tolerated exercise in supine with better ability to follow commands.    Follow Up Recommendations  SNF     Equipment Recommendations  Other (comment) (TBD)    Recommendations for Other Services       Precautions / Restrictions Precautions Precautions: Fall;Knee Precaution Comments: Reviewed keeping knee straight Restrictions Weight Bearing Restrictions: Yes LLE Weight Bearing: Weight bearing as tolerated    Mobility  Bed Mobility Overal bed mobility: Needs Assistance Bed Mobility: Supine to Sit     Supine to sit: Max assist;Total assist;+2 for physical assistance     General bed mobility comments: Pt required assist from PTA to advance LEs to edge of bed. Tech provided assist to elevate his trunk while PTA scooted patient forward.  Pt remains rigid and tense with mobility.  Remains fearful of pain and falling.    Transfers Overall transfer level: Needs assistance Equipment used: Rolling walker (2 wheeled) Transfers: Sit to/from Stand Sit to Stand: Max assist;+2 physical assistance         General transfer comment: Cues for hand placement, pt pushing backwards and required max assist for forward weight shifting to achieve a more upright posture.  Pt's balance improved as tx progressed.  From zero-poor.    Ambulation/Gait Ambulation/Gait  assistance: Max assist Ambulation Distance (Feet): 25 Feet Assistive device: Rolling walker (2 wheeled) Gait Pattern/deviations: Step-to pattern;Antalgic;Trunk flexed;Decreased stride length;Shuffle Gait velocity: slow   General Gait Details: Knee immobilizer donned.  Pt able to progress gait distance and follow commands to improve quality of gait.  Constant cues for upper trunk control and constant cues for increasing stride length.  Close chair follow for safety.     Stairs            Wheelchair Mobility    Modified Rankin (Stroke Patients Only)       Balance Overall balance assessment: Needs assistance Sitting-balance support: Feet supported;Bilateral upper extremity supported Sitting balance-Leahy Scale: Poor       Standing balance-Leahy Scale: Zero Standing balance comment: Max assist at all time for safety.                              Cognition Arousal/Alertness: Awake/alert Behavior During Therapy: WFL for tasks assessed/performed Overall Cognitive Status: Impaired/Different from baseline Area of Impairment: Awareness;Problem solving;Following commands;Safety/judgement;Memory                     Memory: Decreased short-term memory Following Commands: Follows one step commands with increased time;Follows one step commands inconsistently Safety/Judgement: Decreased awareness of safety;Decreased awareness of deficits   Problem Solving: Slow processing General Comments: Pt with STM deficits.  Pt more alert during session.        Exercises Total Joint Exercises Ankle Circles/Pumps: AROM;Both;10 reps;Supine Quad  Sets: AROM;Left;10 reps;Supine Towel Squeeze: AROM;Both;10 reps;Supine Heel Slides: AAROM;Left;10 reps;Supine Hip ABduction/ADduction: AAROM;10 reps;Supine;Left Straight Leg Raises: AAROM;Left;10 reps;Supine Long Arc Quad: Left;10 reps;AROM;Seated Goniometric ROM: 18-86    General Comments        Pertinent Vitals/Pain Pain  Assessment: Faces Pain Score: 7  Pain Location: L knee Pain Descriptors / Indicators: Constant;Discomfort;Grimacing;Guarding;Operative site guarding;Sharp;Shooting Pain Intervention(s): Premedicated before session;Repositioned;Ice applied;Monitored during session    Home Living                      Prior Function            PT Goals (current goals can now be found in the care plan section) Acute Rehab PT Goals Patient Stated Goal: Stop pain Potential to Achieve Goals: Fair Progress towards PT goals: Progressing toward goals    Frequency    7X/week      PT Plan Current plan remains appropriate    Co-evaluation              AM-PAC PT "6 Clicks" Daily Activity  Outcome Measure  Difficulty turning over in bed (including adjusting bedclothes, sheets and blankets)?: Total Difficulty moving from lying on back to sitting on the side of the bed? : Total Difficulty sitting down on and standing up from a chair with arms (e.g., wheelchair, bedside commode, etc,.)?: Total Help needed moving to and from a bed to chair (including a wheelchair)?: A Lot Help needed walking in hospital room?: A Lot Help needed climbing 3-5 steps with a railing? : Total 6 Click Score: 8    End of Session Equipment Utilized During Treatment: Gait belt Activity Tolerance: Patient limited by pain Patient left: in bed;with call bell/phone within reach Nurse Communication: Mobility status PT Visit Diagnosis: Repeated falls (R29.6);Difficulty in walking, not elsewhere classified (R26.2);Pain Pain - Right/Left: Left Pain - part of body: Knee;Leg     Time: 1206-1239 PT Time Calculation (min) (ACUTE ONLY): 33 min  Charges:  $Gait Training: 8-22 mins $Therapeutic Exercise: 8-22 mins                    G Codes:       Gerald RuaAimee Obadiah Lewis, PTA pager [phone]    Gerald Lewis 03/26/2017, 2:31 PM

## 2017-03-27 MED ORDER — CEPHALEXIN 500 MG PO CAPS: 500.0000 mg | ORAL_CAPSULE | Freq: Every day | ORAL | 0 refills | Status: AC

## 2017-03-27 MED ORDER — TAMSULOSIN HCL 0.4 MG PO CAPS: 0.4000 mg | ORAL_CAPSULE | Freq: Every day | ORAL | 0 refills | Status: AC

## 2017-03-27 MED ORDER — ASPIRIN 325 MG PO TBEC: 325.0000 mg | DELAYED_RELEASE_TABLET | Freq: Two times a day (BID) | ORAL | 0 refills | Status: AC

## 2017-03-27 MED ORDER — TRAMADOL HCL 50 MG PO TABS: 50.0000 mg | ORAL_TABLET | Freq: Four times a day (QID) | ORAL | 0 refills | Status: AC | PRN

## 2017-03-27 NOTE — Discharge Summary (Signed)
Patient ID: Gerald Lewis MRN: [id number] DOB/AGE: [birth year] 82 y.o.  Admit date: 03/22/2017 Discharge date: 03/27/2017  Admission Diagnoses:  Active Problems:   Loose total knee arthroplasty [facility])   Discharge Diagnoses:  Same  Past Medical History:  Diagnosis Date  . Cataract    B/L  . DJD (degenerative joint disease)   . Hypertension   . Pneumonia   . Wears glasses     Surgeries: Procedure(s): ATTEMPTED CLOSED REDUCTION TOTAL KNEE  INJECTION RIGHT KNEE  TOTAL KNEE REVISION LEFT KNEE on 03/22/2017 - 03/23/2017   Consultants:   Discharged Condition: Improved  Hospital Course: Gerald Lewis is an 82 y.o. male who was admitted 03/22/2017 for operative treatment of<principal problem not specified>. Patient has severe unremitting pain that affects sleep, daily activities, and work/hobbies. After pre-op clearance the patient was taken to the operating room on 03/22/2017 - 03/23/2017 and underwent  Procedure(s): ATTEMPTED CLOSED REDUCTION TOTAL KNEE  INJECTION RIGHT KNEE  TOTAL KNEE REVISION LEFT KNEE.    Patient was given perioperative antibiotics: Anti-infectives    Start     Dose/Rate Route Frequency Ordered Stop   03/27/17 0000  cephALEXin (KEFLEX) 500 MG capsule     500 mg Oral Daily at bedtime 03/27/17 0806     03/26/17 2200  cephALEXin (KEFLEX) capsule 500 mg     500 mg Oral Daily at bedtime 03/26/17 1312 03/31/17 2159   03/23/17 0943  cefUROXime (ZINACEF) injection  Status:  Discontinued       As needed 03/23/17 0943 03/23/17 1047   03/23/17 0600  vancomycin (VANCOCIN) IVPB 750 mg/150 ml premix  Status:  Discontinued     750 mg 150 mL/hr over 60 Minutes Intravenous Every 12 hours 03/22/17 1635 03/24/17 0843   03/22/17 1800  piperacillin-tazobactam (ZOSYN) IVPB 3.375 g  Status:  Discontinued     3.375 g 12.5 mL/hr over 240 Minutes Intravenous Every 8 hours 03/22/17 1635 03/24/17 0843   03/22/17 1700  vancomycin (VANCOCIN) 1,500 mg in sodium chloride 0.9 % 500 mL IVPB      1,500 mg 250 mL/hr over 120 Minutes Intravenous  Once 03/22/17 1635 03/23/17 1526       Patient was given sequential compression devices, early ambulation, and chemoprophylaxis to prevent DVT.  Patient was found to have urinary retention.  I spoke with urology over the phone.  They recommended keeping his catheter in place for at least 5 days.  After that it should be removed. I have also placed him on Flomax to take daily.  He will also take an antibiotic at nightto help prevent UTI per urology.  Patient benefited maximally from hospital stay and there were no complications.    Recent vital signs: Patient Vitals for the past 24 hrs:  BP Temp Temp src Pulse Resp SpO2  03/27/17 0423 (!) 161/83 97.9 F (36.6 C) Oral 79 18 97 %  03/26/17 2100 (!) 162/67 98.8 F (37.1 C) Oral 79 - 96 %  03/26/17 1828 (!) 180/71 97.7 F (36.5 C) Oral 80 - 98 %     Recent laboratory studies:  Recent Labs  03/25/17 0514 03/26/17 0607  WBC 10.0 9.7  HGB 9.3* 9.2*  HCT 28.4* 29.0*  PLT 345 427*     Discharge Medications:   Allergies as of 03/27/2017      Reactions   No Known Allergies       Medication List    TAKE these medications   ASPERCREME LIDOCAINE EX Apply 1 application topically  daily as needed (pains).   aspirin 325 MG EC tablet Take 1 tablet (325 mg total) by mouth 2 (two) times daily after a meal.   bisacodyl 5 MG EC tablet Commonly known as:  DULCOLAX Take 1 tablet (5 mg total) by mouth daily as needed for moderate constipation.   cephALEXin 500 MG capsule Commonly known as:  KEFLEX Take 1 capsule (500 mg total) by mouth at bedtime.   cholecalciferol 1000 units tablet Commonly known as:  VITAMIN D Take 1,000 Units by mouth daily.   docusate sodium 100 MG capsule Commonly known as:  COLACE Take 1 capsule (100 mg total) by mouth 2 (two) times daily.   Fish Oil 1000 MG Caps Take 1 capsule by mouth daily.   Ginkgo Biloba 40 MG Tabs Take 1 tablet by mouth daily.    lisinopril 20 MG tablet Commonly known as:  PRINIVIL,ZESTRIL Take 20 mg by mouth daily.   methocarbamol 500 MG tablet Commonly known as:  ROBAXIN Take 1 tablet (500 mg total) by mouth every 6 (six) hours as needed for muscle spasms.   multivitamin with minerals tablet Take 1 tablet by mouth daily.   MUSCLE RUB 10-15 % Crea Apply 1 application topically daily as needed for muscle pain.   saw palmetto 160 MG capsule Take 160 mg by mouth daily.   tamsulosin 0.4 MG Caps capsule Commonly known as:  FLOMAX Take 1 capsule (0.4 mg total) by mouth daily.   traMADol 50 MG tablet Commonly known as:  ULTRAM Take 1-2 tablets (50-100 mg total) by mouth every 6 (six) hours as needed for moderate pain or severe pain. What changed:  how much to take   Turmeric 500 MG Caps Take 1 capsule by mouth daily.   vitamin C 500 MG tablet Commonly known as:  ASCORBIC ACID Take 1,000 mg by mouth daily.       Diagnostic Studies: Dg Chest 2 View  Result Date: 03/11/2017 CLINICAL DATA:  New preoperative exam.  Knee arthroplasty . EXAM: CHEST  2 VIEW COMPARISON:  01/31/2016 . FINDINGS: Mediastinum and hilar structures normal. Cardiomegaly with normal pulmonary vascularity. Calcified nodules noted consistent with granulomas. Low lung volumes with mild basilar atelectasis. No pleural effusion or pneumothorax. Degenerative changes thoracic spine. IMPRESSION: 1. Low lung volumes with mild basilar atelectasis. 2. Cardiomegaly.  No pulmonary venous congestion. Electronically Signed   By: Maisie Fus  Register   On: 03/11/2017 14:43   Dg Knee 1-2 Views Left  Result Date: 03/18/2017 CLINICAL DATA:  Postop left knee pain. EXAM: LEFT KNEE - 1-2 VIEW COMPARISON:  10/27/2016 FINDINGS: Total knee arthroplasty yesterday with no periprosthetic fracture or subluxation. The prosthesis appears well seated. Expected soft tissue gas and swelling. IMPRESSION: Recent total knee arthroplasty.  No unexpected finding. Electronically  Signed   By: Marnee Spring M.D.   On: 03/18/2017 09:41   Dg Chest Port 1 View  Result Date: 03/22/2017 CLINICAL DATA:  82 year old male with a history of left knee pain EXAM: PORTABLE CHEST 1 VIEW COMPARISON:  03/11/2017, 01/31/2016 FINDINGS: Cardiomediastinal silhouette unchanged in size and contour. Calcifications of the aortic arch. Low lung volumes. No pneumothorax. Opacity at the left base with blunting of the left costophrenic angle. No evidence of interlobular septal thickening. No displaced fracture. IMPRESSION: Low lung volumes with blunting of the left costophrenic angle, potentially atelectasis, although developing consolidation not excluded. If there is concern for acute lung process, formal PA and lateral chest x-ray may be useful. Electronically Signed   By:  Gilmer Mor D.O.   On: 03/22/2017 12:48   Dg Knee Complete 4 Views Left  Result Date: 03/23/2017 CLINICAL DATA:  Closed reduction left knee. EXAM: LEFT KNEE - COMPLETE 4+ VIEW; DG C-ARM 61-120 MIN COMPARISON:  None. FINDINGS: Patient has had a prior total knee replacement. Posterior subluxation of the tibia is again noted on image number 4. Re- alignment appears be present on image number 1 No fracture identified . IMPRESSION: Posterior subluxation of the tibia is again noted on image number 4. Re- alignment appears to be present on image number 1. Prior total knee replacement. Hardware intact. No fracture noted. Electronically Signed   By: Maisie Fus  Register   On: 03/23/2017 08:51   Dg Knee Complete 4 Views Left  Result Date: 03/22/2017 CLINICAL DATA:  82 y/o M; history of knee surgery 03/17/2017 with pain and swelling. EXAM: LEFT KNEE - COMPLETE 4+ VIEW COMPARISON:  None. FINDINGS: Total knee arthroplasty. No periprosthetic lucency or fracture identified. The femoral prosthesis is subluxed anteriorly and medially relative to the tibial prosthesis. There is a joint effusion and foci of air within soft tissues. IMPRESSION: Total knee  arthroplasty with malalignment. Anterior and medial subluxation of the femoral prosthesis relative to the tibial prosthesis. Knee joint effusion and air within soft tissues may be postoperative given recent surgery. Electronically Signed   By: Mitzi Hansen M.D.   On: 03/22/2017 13:23   Dg C-arm 1-60 Min  Result Date: 03/23/2017 CLINICAL DATA:  Closed reduction left knee. EXAM: LEFT KNEE - COMPLETE 4+ VIEW; DG C-ARM 61-120 MIN COMPARISON:  None. FINDINGS: Patient has had a prior total knee replacement. Posterior subluxation of the tibia is again noted on image number 4. Re- alignment appears be present on image number 1 No fracture identified . IMPRESSION: Posterior subluxation of the tibia is again noted on image number 4. Re- alignment appears to be present on image number 1. Prior total knee replacement. Hardware intact. No fracture noted. Electronically Signed   By: Maisie Fus  Register   On: 03/23/2017 08:51    Disposition: 03-Skilled Nursing Facility  Discharge Instructions    Call MD / Call 911    Complete by:  As directed    If you experience chest pain or shortness of breath, CALL 911 and be transported to the hospital emergency room.  If you develope a fever above 101 F, pus (white drainage) or increased drainage or redness at the wound, or calf pain, call your surgeon's office.   Constipation Prevention    Complete by:  As directed    Drink plenty of fluids.  Prune juice may be helpful.  You may use a stool softener, such as Colace (over the counter) 100 mg twice a day.  Use MiraLax (over the counter) for constipation as needed.   Diet - low sodium heart healthy    Complete by:  As directed    Discharge instructions    Complete by:  As directed    INSTRUCTIONS AFTER JOINT REPLACEMENT   Remove items at home which could result in a fall. This includes throw rugs or furniture in walking pathways ICE to the affected joint every three hours while awake for 30 minutes at a time,  for at least the first 3-5 days, and then as needed for pain and swelling.  Continue to use ice for pain and swelling. You may notice swelling that will progress down to the foot and ankle.  This is normal after surgery.  Elevate your leg  when you are not up walking on it.   Continue to use the breathing machine you got in the hospital (incentive spirometer) which will help keep your temperature down.  It is common for your temperature to cycle up and down following surgery, especially at night when you are not up moving around and exerting yourself.  The breathing machine keeps your lungs expanded and your temperature down.   DIET:  As you were doing prior to hospitalization, we recommend a well-balanced diet.  DRESSING / WOUND CARE / SHOWERING  You may shower 3 days after surgery, but keep the wounds dry during showering.  You may use an occlusive plastic wrap (Press'n Seal for example), NO SOAKING/SUBMERGING IN THE BATHTUB.  If the bandage gets wet, change with a clean dry gauze.  If the incision gets wet, pat the wound dry with a clean towel.  ACTIVITY  Increase activity slowly as tolerated, but follow the weight bearing instructions below.   No driving for 6 weeks or until further direction given by your physician.  You cannot drive while taking narcotics.  No lifting or carrying greater than 10 lbs. until further directed by your surgeon. Avoid periods of inactivity such as sitting longer than an hour when not asleep. This helps prevent blood clots.  You may return to work once you are authorized by your doctor.     WEIGHT BEARING   Weight bearing as tolerated with assist device (walker, cane, etc) as directed, use it as long as suggested by your surgeon or therapist, typically at least 4-6 weeks.   EXERCISES  Results after joint replacement surgery are often greatly improved when you follow the exercise, range of motion and muscle strengthening exercises prescribed by your doctor.  Safety measures are also important to protect the joint from further injury. Any time any of these exercises cause you to have increased pain or swelling, decrease what you are doing until you are comfortable again and then slowly increase them. If you have problems or questions, call your caregiver or physical therapist for advice.   Rehabilitation is important following a joint replacement. After just a few days of immobilization, the muscles of the leg can become weakened and shrink (atrophy).  These exercises are designed to build up the tone and strength of the thigh and leg muscles and to improve motion. Often times heat used for twenty to thirty minutes before working out will loosen up your tissues and help with improving the range of motion but do not use heat for the first two weeks following surgery (sometimes heat can increase post-operative swelling).   These exercises can be done on a training (exercise) mat, on the floor, on a table or on a bed. Use whatever works the best and is most comfortable for you.    Use music or television while you are exercising so that the exercises are a pleasant break in your day. This will make your life better with the exercises acting as a break in your routine that you can look forward to.   Perform all exercises about fifteen times, three times per day or as directed.  You should exercise both the operative leg and the other leg as well.   Exercises include:   Quad Sets - Tighten up the muscle on the front of the thigh (Quad) and hold for 5-10 seconds.   Straight Leg Raises - With your knee straight (if you were given a brace, keep it on), lift the leg  to 60 degrees, hold for 3 seconds, and slowly lower the leg.  Perform this exercise against resistance later as your leg gets stronger.  Leg Slides: Lying on your back, slowly slide your foot toward your buttocks, bending your knee up off the floor (only go as far as is comfortable). Then slowly slide your  foot back down until your leg is flat on the floor again.  Angel Wings: Lying on your back spread your legs to the side as far apart as you can without causing discomfort.  Hamstring Strength:  Lying on your back, push your heel against the floor with your leg straight by tightening up the muscles of your buttocks.  Repeat, but this time bend your knee to a comfortable angle, and push your heel against the floor.  You may put a pillow under the heel to make it more comfortable if necessary.   A rehabilitation program following joint replacement surgery can speed recovery and prevent re-injury in the future due to weakened muscles. Contact your doctor or a physical therapist for more information on knee rehabilitation.    CONSTIPATION  Constipation is defined medically as fewer than three stools per week and severe constipation as less than one stool per week.  Even if you have a regular bowel pattern at home, your normal regimen is likely to be disrupted due to multiple reasons following surgery.  Combination of anesthesia, postoperative narcotics, change in appetite and fluid intake all can affect your bowels.   YOU MUST use at least one of the following options; they are listed in order of increasing strength to get the job done.  They are all available over the counter, and you may need to use some, POSSIBLY even all of these options:    Drink plenty of fluids (prune juice may be helpful) and high fiber foods Colace 100 mg by mouth twice a day  Senokot for constipation as directed and as needed Dulcolax (bisacodyl), take with full glass of water  Miralax (polyethylene glycol) once or twice a day as needed.  If you have tried all these things and are unable to have a bowel movement in the first 3-4 days after surgery call either your surgeon or your primary doctor.    If you experience loose stools or diarrhea, hold the medications until you stool forms back up.  If your symptoms do not get  better within 1 week or if they get worse, check with your doctor.  If you experience "the worst abdominal pain ever" or develop nausea or vomiting, please contact the office immediately for further recommendations for treatment.   ITCHING:  If you experience itching with your medications, try taking only a single pain pill, or even half a pain pill at a time.  You can also use Benadryl over the counter for itching or also to help with sleep.   TED HOSE STOCKINGS:  Use stockings on both legs until for at least 2 weeks or as directed by physician office. They may be removed at night for sleeping.  MEDICATIONS:  See your medication summary on the "After Visit Summary" that nursing will review with you.  You may have some home medications which will be placed on hold until you complete the course of blood thinner medication.  It is important for you to complete the blood thinner medication as prescribed.  PRECAUTIONS:  If you experience chest pain or shortness of breath - call 911 immediately for transfer to the hospital emergency department.  If you develop a fever greater that 101 F, purulent drainage from wound, increased redness or drainage from wound, foul odor from the wound/dressing, or calf pain - CONTACT YOUR SURGEON.                                                   FOLLOW-UP APPOINTMENTS:  If you do not already have a post-op appointment, please call the office for an appointment to be seen by your surgeon.  Guidelines for how soon to be seen are listed in your "After Visit Summary", but are typically between 1-4 weeks after surgery.  OTHER INSTRUCTIONS:   Knee Replacement:  Do not place pillow under knee, focus on keeping the knee straight while resting. CPM instructions: 0-90 degrees, 2 hours in the morning, 2 hours in the afternoon, and 2 hours in the evening. Place foam block, curve side up under heel at all times except when in CPM or when walking.  DO NOT modify, tear, cut, or change  the foam block in any way.  MAKE SURE YOU:  Understand these instructions.  Get help right away if you are not doing well or get worse.    Thank you for letting us be a part of your medical care team.  It is a privilege we respect greatly.  We hope these instructions will help you stay on track for a fast and full recovery!   Increase activity slowly as tolerated    Complete by:  As directed       Follow-up Information    Marcene Corning, MD. Schedule an appointment as soon as possible for a visit in 2 week(s).   Specialty:  Orthopedic Surgery Contact information: [address] [place] [address] [phone]            Signed: Drema Halon 03/27/2017, 10:27 AM

## 2017-03-27 NOTE — Progress Notes (Signed)
Subjective: 4 Days Post-Op Procedure(s) (LRB): ATTEMPTED CLOSED REDUCTION TOTAL KNEE  INJECTION RIGHT KNEE  TOTAL KNEE REVISION LEFT KNEE (Left)   Patient sitting up in bed eating breakfast. He had a BM last night. I spoke to urology yesterday and they recommended 5 days of bladder rest with the foley and to continue on flomax and also abx QHS while catheter is in to prevent UTI.   Activity level:  wbat Diet tolerance:  ok Voiding:  Foley in place and BM last night Patient reports pain as mild.    Objective: Vital signs in last 24 hours: Temp:  [97.7 F (36.5 C)-98.8 F (37.1 C)] 97.9 F (36.6 C) (06/22 0423) Pulse Rate:  [79-80] 79 (06/22 0423) Resp:  [18] 18 (06/22 0423) BP: (161-180)/(67-83) 161/83 (06/22 0423) SpO2:  [96 %-98 %] 97 % (06/22 0423)  Labs:  Recent Labs  03/25/17 0514 03/26/17 0607  HGB 9.3* 9.2*    Recent Labs  03/25/17 0514 03/26/17 0607  WBC 10.0 9.7  RBC 3.41* 3.49*  HCT 28.4* 29.0*  PLT 345 427*   No results for input(s): NA, K, CL, CO2, BUN, CREATININE, GLUCOSE, CALCIUM in the last 72 hours. No results for input(s): LABPT, INR in the last 72 hours.  Physical Exam:  Neurologically intact ABD soft Neurovascular intact Sensation intact distally Intact pulses distally Dorsiflexion/Plantar flexion intact Incision: dressing C/D/I and no drainage No cellulitis present Compartment soft  Assessment/Plan:  4 Days Post-Op Procedure(s) (LRB): ATTEMPTED CLOSED REDUCTION TOTAL KNEE  INJECTION RIGHT KNEE  TOTAL KNEE REVISION LEFT KNEE (Left) Advance diet Up with therapy Continue foley due to acute urinary retention Discharge to [facility] today. Continue on ASA 325mg  BID x 2 weeks post op. Continue flomax, Keflex 500mg , and foley for 5 days for bladder rest and UTI prevention per urology. If still having difficulty with Urination he will follow up with urology on an outpatient basis. Follow up in office 2 weeks post op. Keep bandage clean  and dry until follow up.   Gerald Lewis, Gerald OrganNDREW PAUL 03/27/2017, 7:59 AM

## 2017-03-27 NOTE — Progress Notes (Signed)
PT Cancellation Note  Patient Details Name: Gerald Lewis MRN: [id number] DOB: [birth year]   Cancelled Treatment:    Reason Eval/Treat Not Completed: (P) Other (comment) (Pt awaiting transport from [facility], will defer PT needs to SNF.  )   Gerald Lewis 03/27/2017, 12:33 PM  Gerald Lewis, PTA pager [phone]

## 2017-03-27 NOTE — Social Work (Signed)
Clinical Social Worker facilitated patient discharge including contacting patient family and facility to confirm patient discharge plans.  Clinical information faxed to facility and family agreeable with plan.    CSW arranged ambulance transport via [facility] to [facility].    RN to call [phone] to give report prior to discharge.  Clinical Social Worker will sign off for now as social work intervention is no longer needed. Please consult us again if new need arises.  Keene BreathPatricia Maelin Kurkowski, LCSW Clinical Social Worker [phone]

## 2017-03-27 NOTE — Plan of Care (Signed)
Problem: Education: Goal: Knowledge of [facility] Education information/materials will improve Outcome: Progressing POC reviewed with pt.   

## 2017-03-27 NOTE — Clinical Social Work Placement (Signed)
   CLINICAL SOCIAL WORK PLACEMENT  NOTE  Date:  03/27/2017  Patient Details  Name: Gerald HughsCarl E Oppedisano MRN: [id number] Date of Birth: 23-Jan-1935  Clinical Social Work is seeking post-discharge placement for this patient at the Skilled  Nursing Facility level of care (*CSW will initial, date and re-position this form in  chart as items are completed):  Yes   Patient/family provided with [facility] Clinical Social Work Department's list of facilities offering this level of care within the geographic area requested by the patient (or if unable, by the patient's family).  Yes   Patient/family informed of their freedom to choose among providers that offer the needed level of care, that participate in Medicare, Medicaid or managed care program needed by the patient, have an available bed and are willing to accept the patient.  Yes   Patient/family informed of [facility] [place] [facility]'s ownership interest in [facility] and [facility], as well as of the fact that they are under no obligation to receive care at these facilities.  PASRR submitted to EDS on       PASRR number received on 03/25/17     Existing PASRR number confirmed on       FL2 transmitted to all facilities in geographic area requested by pt/family on 03/25/17     FL2 transmitted to all facilities within larger geographic area on 03/25/17     Patient informed that his/her managed care company has contracts with or will negotiate with certain facilities, including the following:        Yes   Patient/family informed of bed offers received.  [facility] bed [facility]     Physician recommends and patient chooses bed at      Patient to be [facility] on 03/27/17.  Patient to be transferred to facility by [facility]     Patient family notified on 03/27/17 of transfer.  Name of family member notified:  son     PHYSICIAN Please prepare priority discharge summary, including medications, Please prepare  prescriptions, Please sign FL2     Additional Comment:    _______________________________________________ Tresa MoorePatricia V Jane Broughton, LCSW 03/27/2017, 10:36 AM

## 2017-03-27 NOTE — Progress Notes (Signed)
Discharging patient via [facility] to [facility]; report received by Hollie BeachKanjah, LPN; prescriptions sent in discharge packet.

## 2017-05-15 NOTE — Addendum Note (Signed)
Addendum  created 05/15/17 1227 by Leonides GrillsEllender, Ryan P, MD   Anesthesia Intra Blocks edited, Sign clinical note

## 2018-08-25 IMAGING — CR DG CHEST 2V
2 series · 2 of 2 positions shown · non-contrast
Comparison: 01/31/2016 .

CLINICAL DATA: New preoperative exam.  Knee arthroplasty .

EXAM:
CHEST  2 VIEW

[w chest pa]
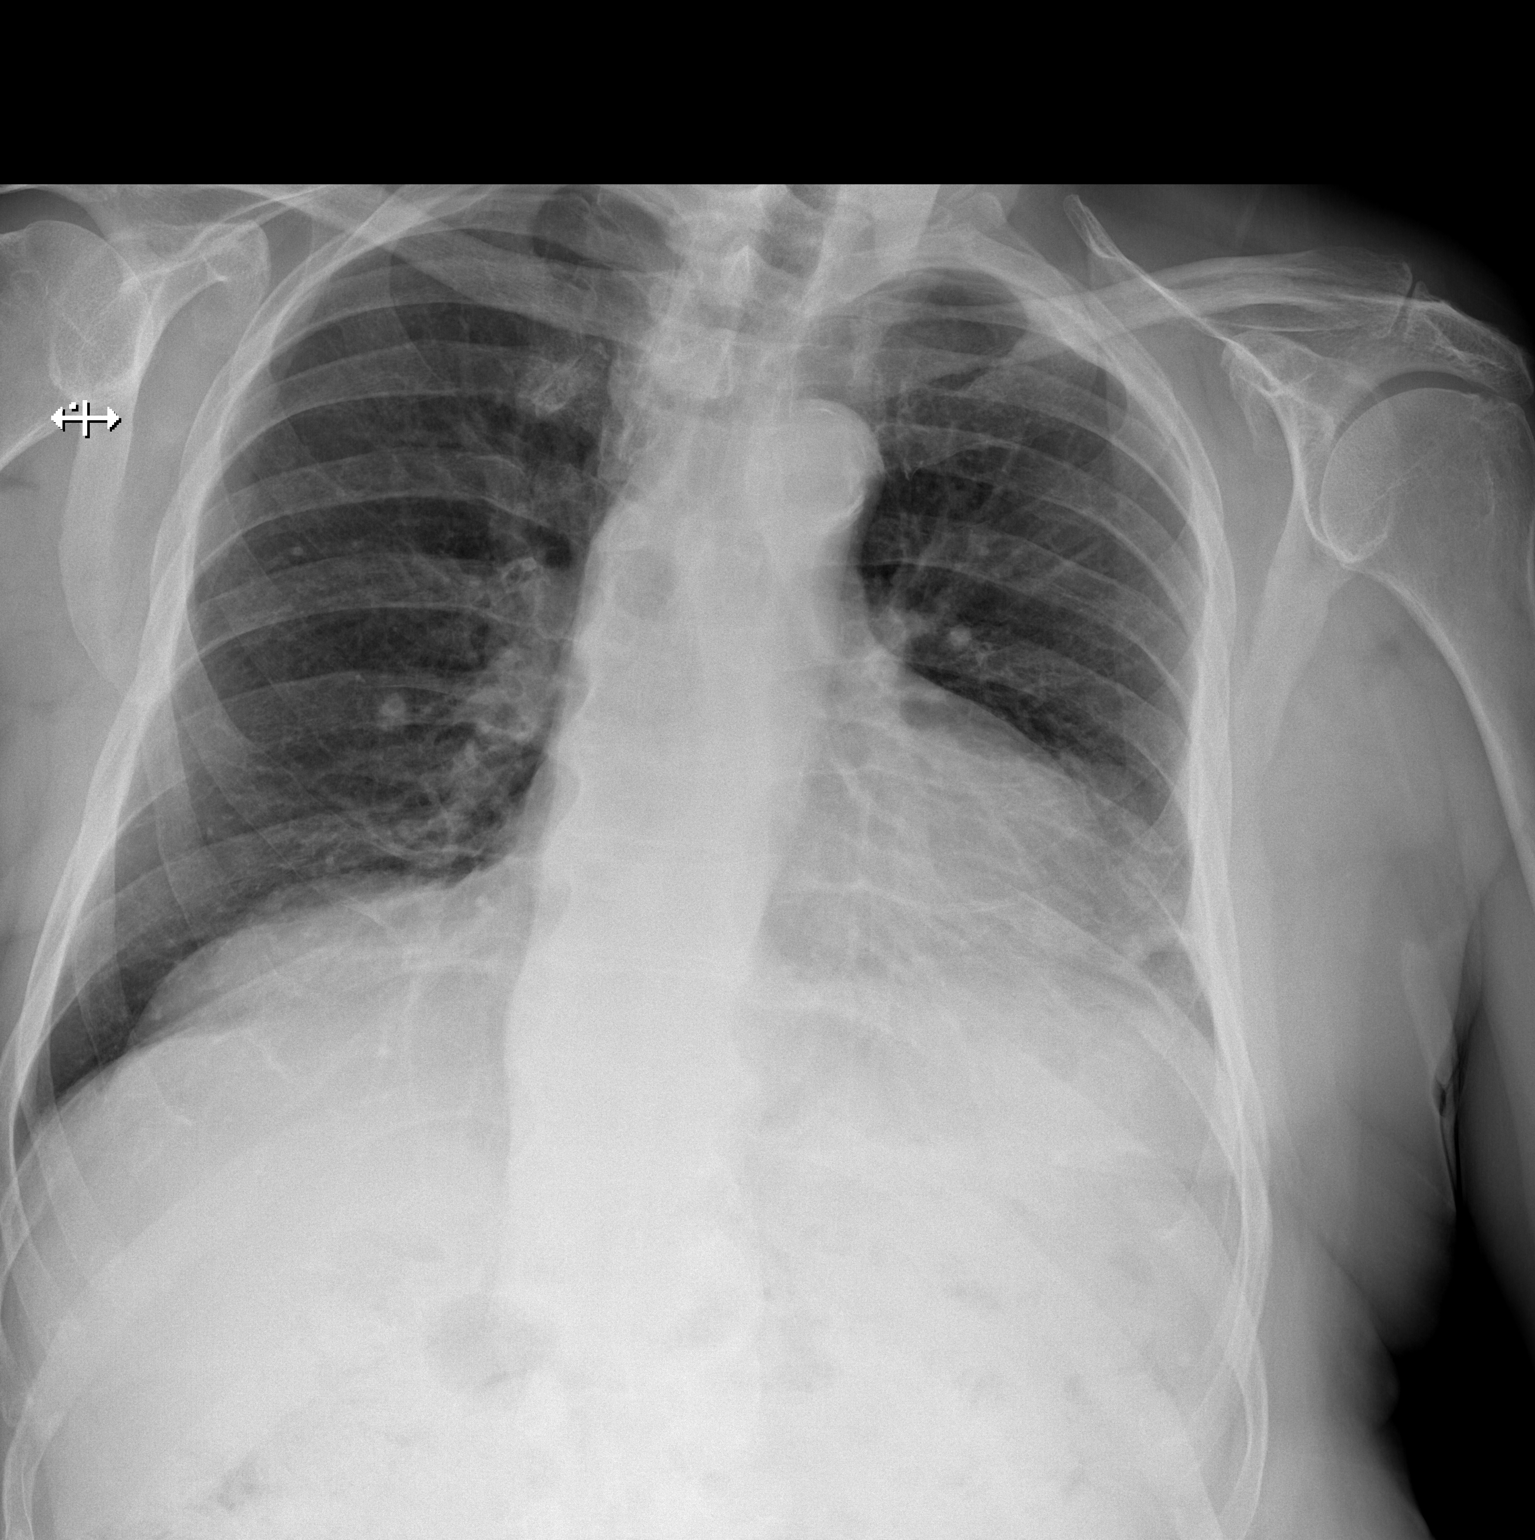

[w chest lat]
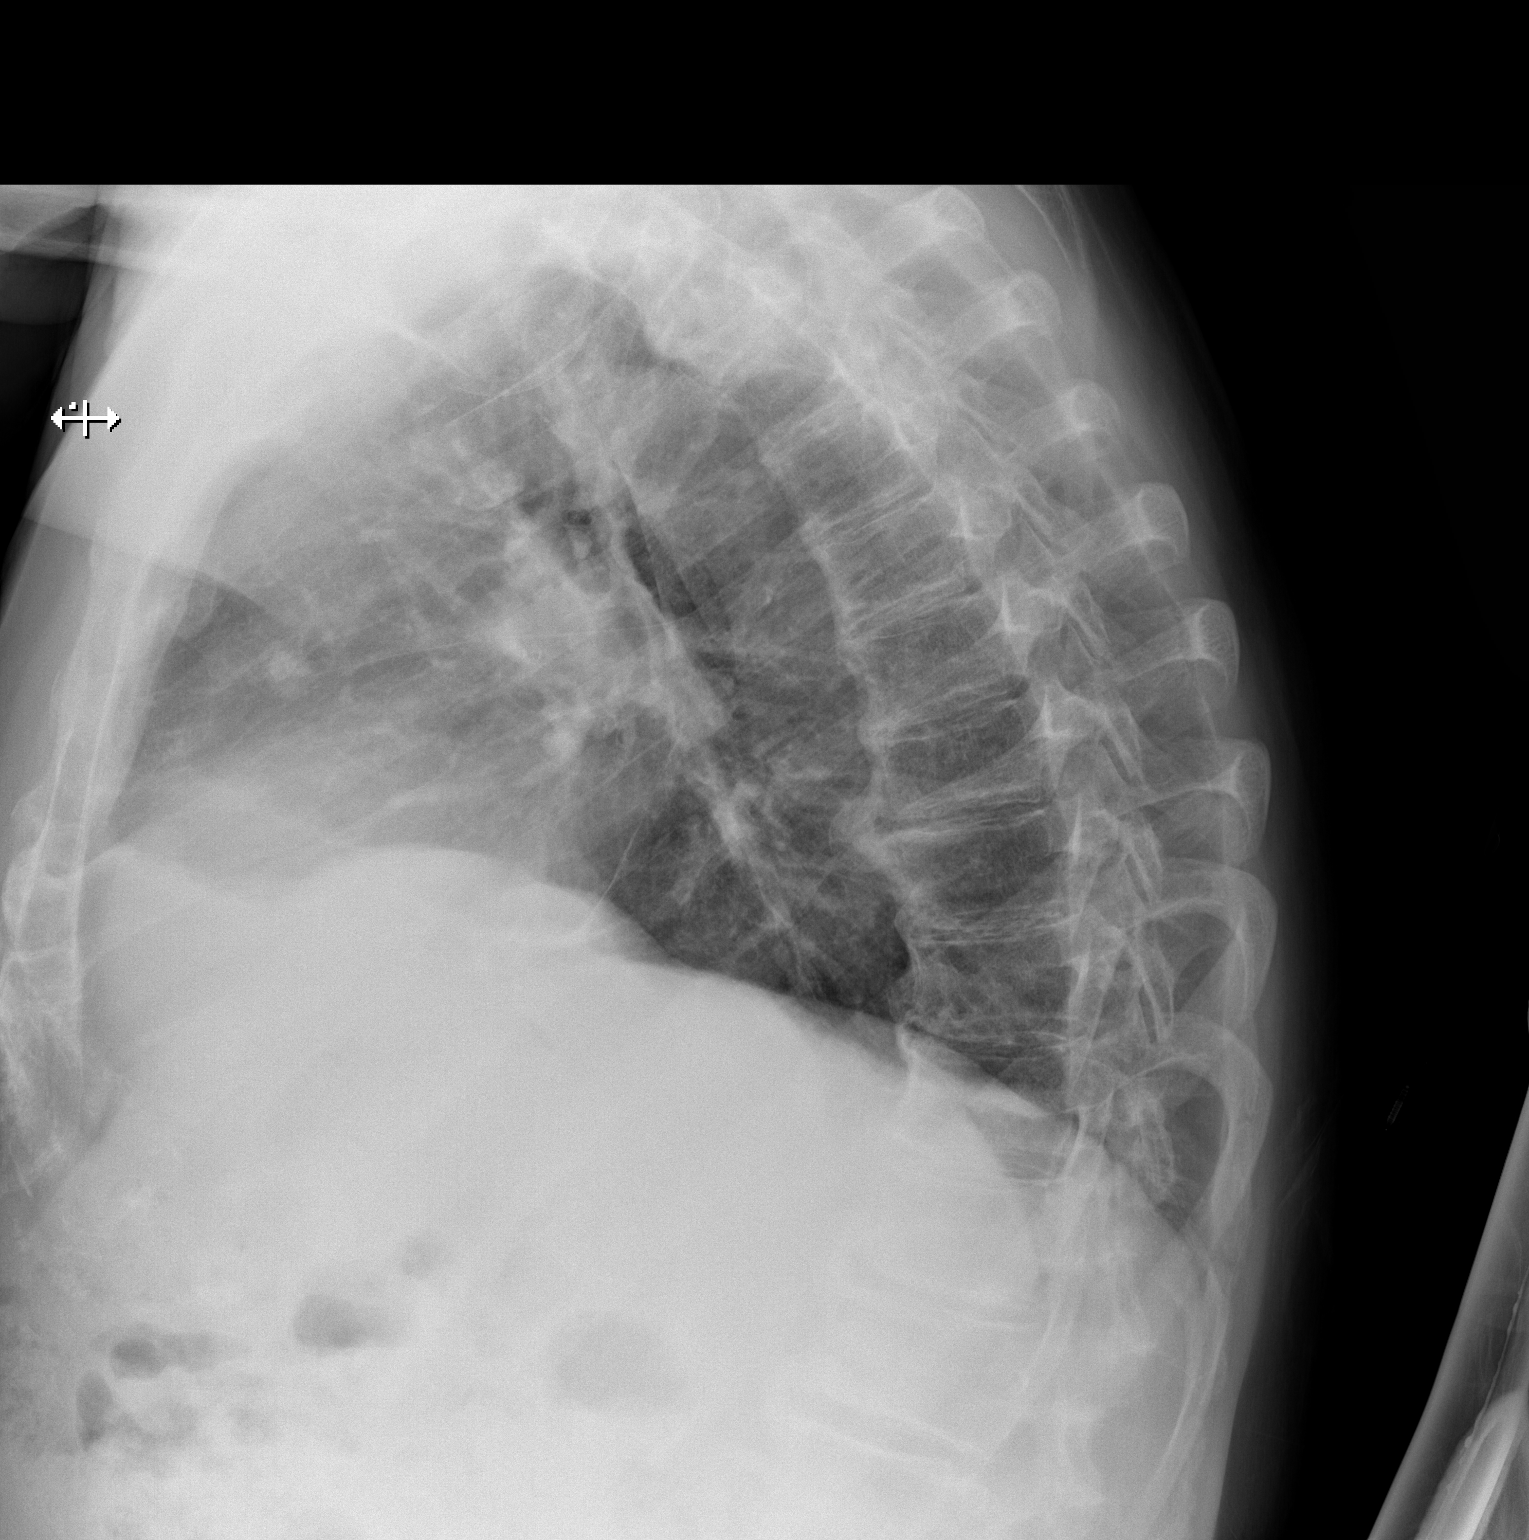

[2 of 2 positions shown; findings below may reference images not displayed]

FINDINGS: Mediastinum and hilar structures normal. Cardiomegaly with normal
pulmonary vascularity. Calcified nodules noted consistent with
granulomas. Low lung volumes with mild basilar atelectasis. No
pleural effusion or pneumothorax. Degenerative changes thoracic
spine.
IMPRESSION: 1. Low lung volumes with mild basilar atelectasis.

2. Cardiomegaly.  No pulmonary venous congestion.

## 2018-09-01 IMAGING — CR DG KNEE 1-2V*L*
2 series · 2 of 2 positions shown · non-contrast
Comparison: 10/27/2016

CLINICAL DATA: Postop left knee pain.

EXAM:
LEFT KNEE - 1-2 VIEW

[knee ap]
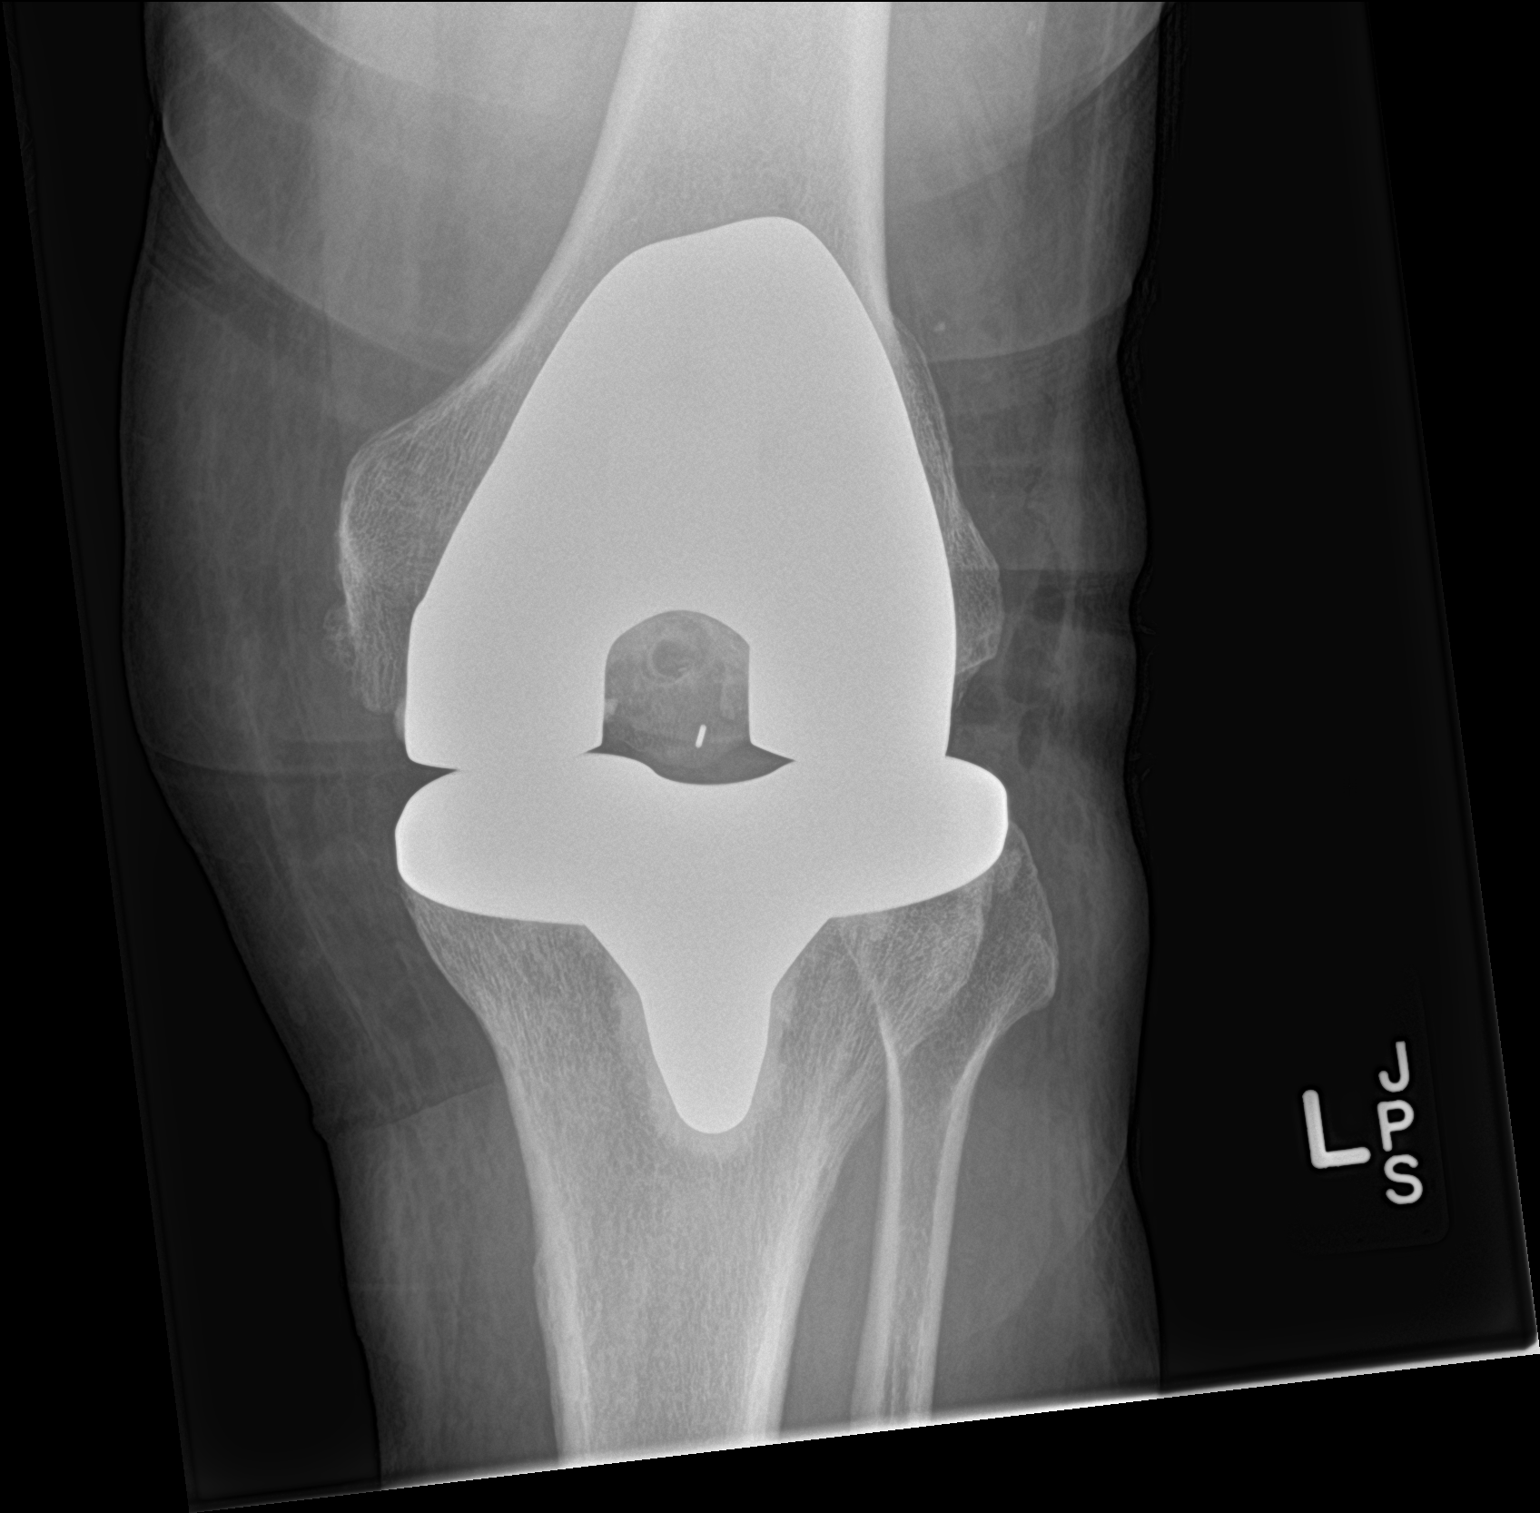

[knee lat]
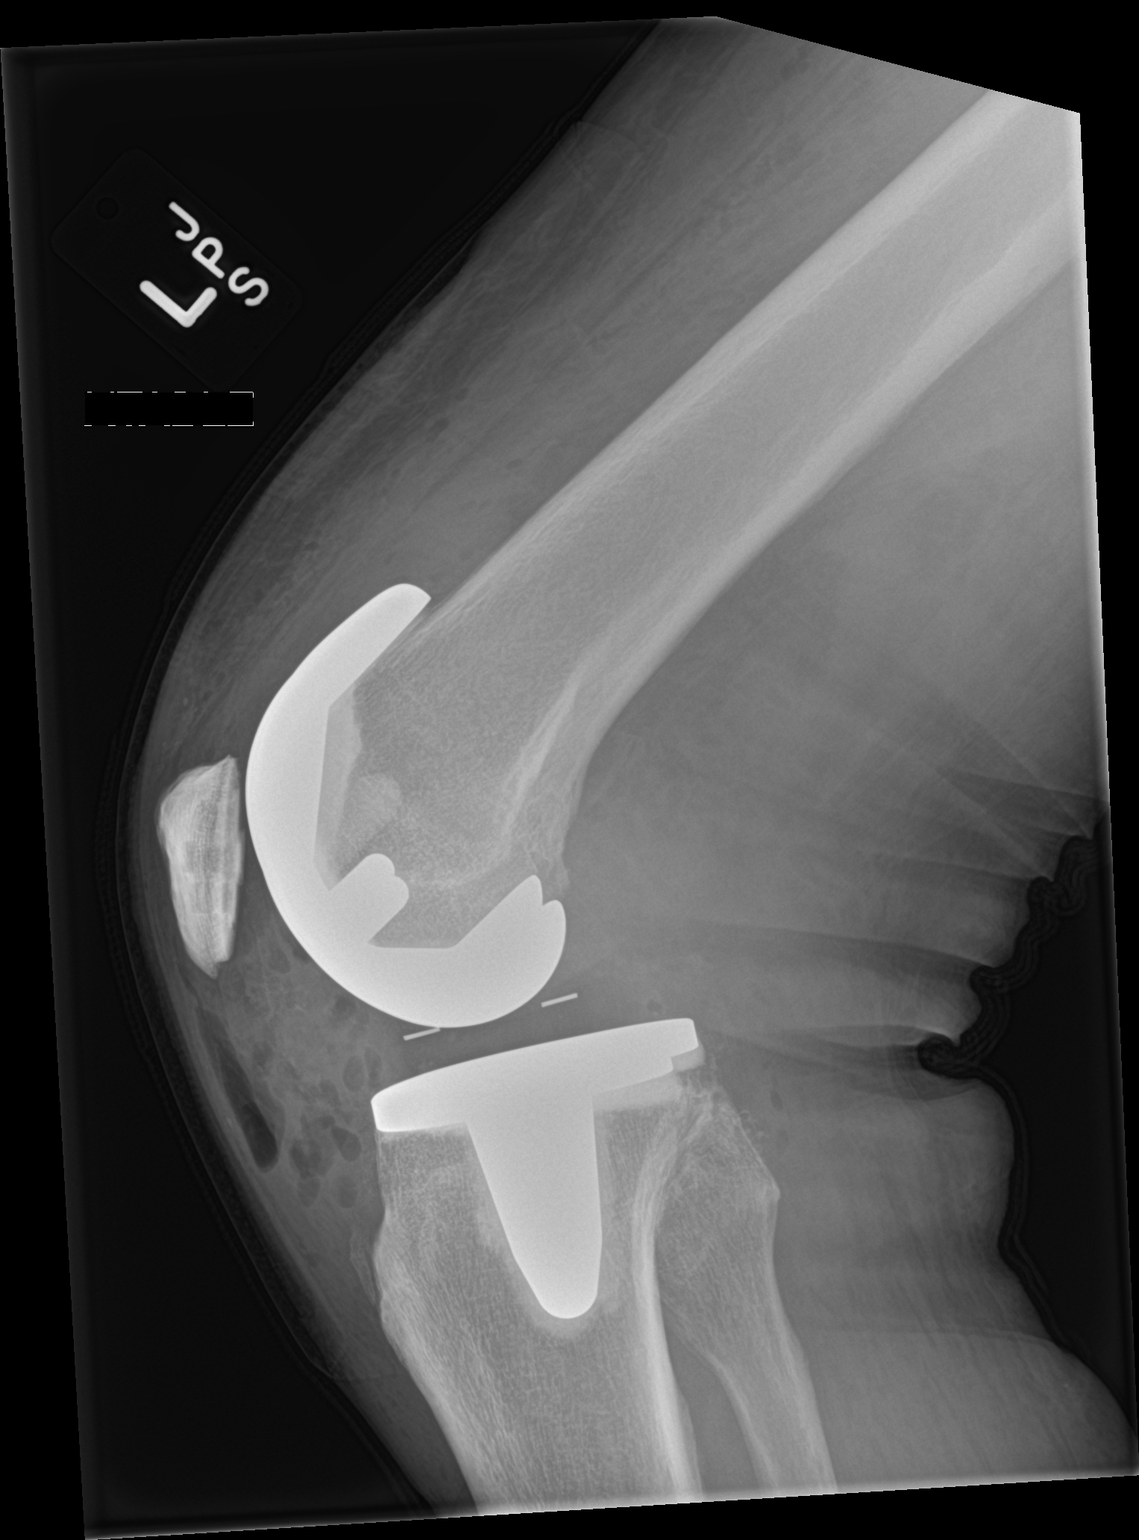

[2 of 2 positions shown; findings below may reference images not displayed]

FINDINGS: Total knee arthroplasty yesterday with no periprosthetic fracture or
subluxation. The prosthesis appears well seated. Expected soft
tissue gas and swelling.
IMPRESSION: Recent total knee arthroplasty.  No unexpected finding.

## 2018-09-06 IMAGING — RF DG C-ARM 61-120 MIN
1 series · 4 of 4 positions shown · non-contrast
Comparison: None.

CLINICAL DATA: Closed reduction left knee.

EXAM:
LEFT KNEE - COMPLETE 4+ VIEW; DG C-ARM 61-120 MIN

[Series 1: run · 4 of 4 slices shown]
[im 1/4]
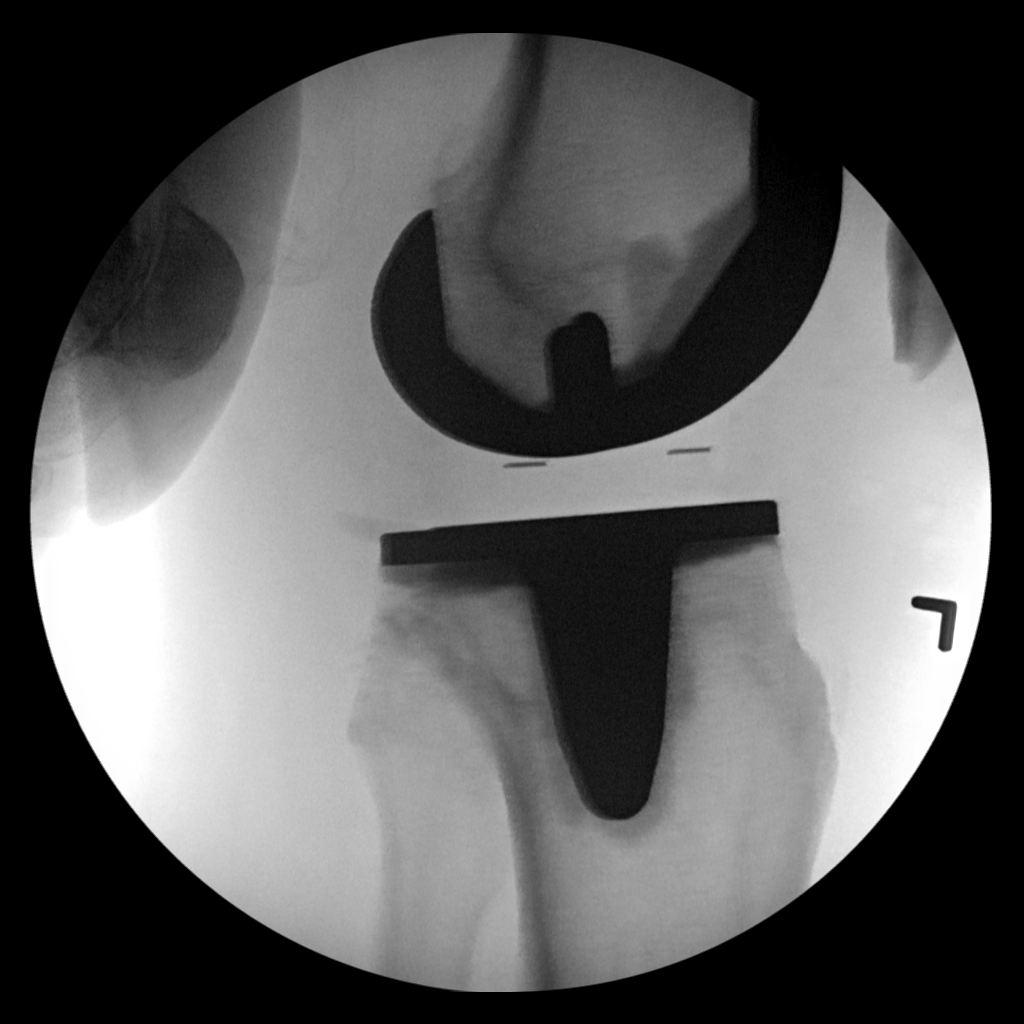
[im 2/4]
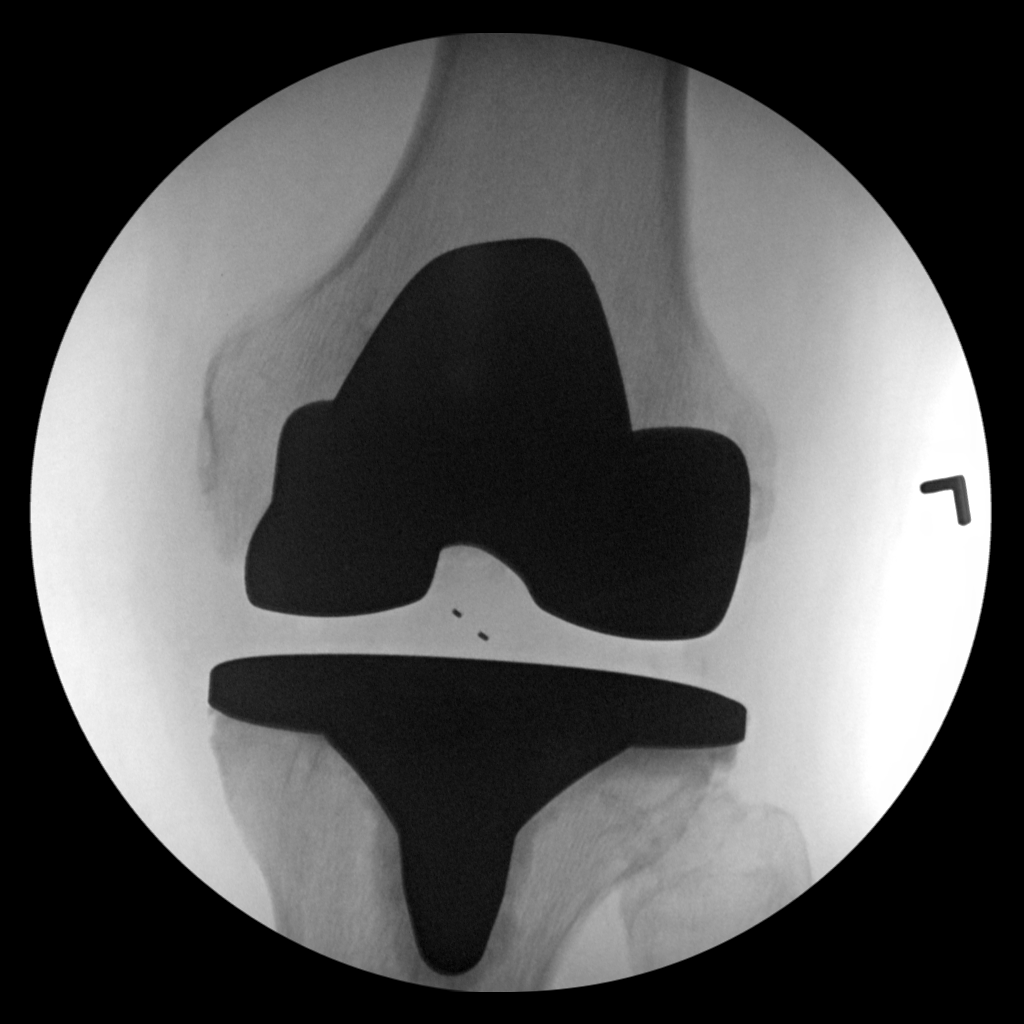
[im 3/4]
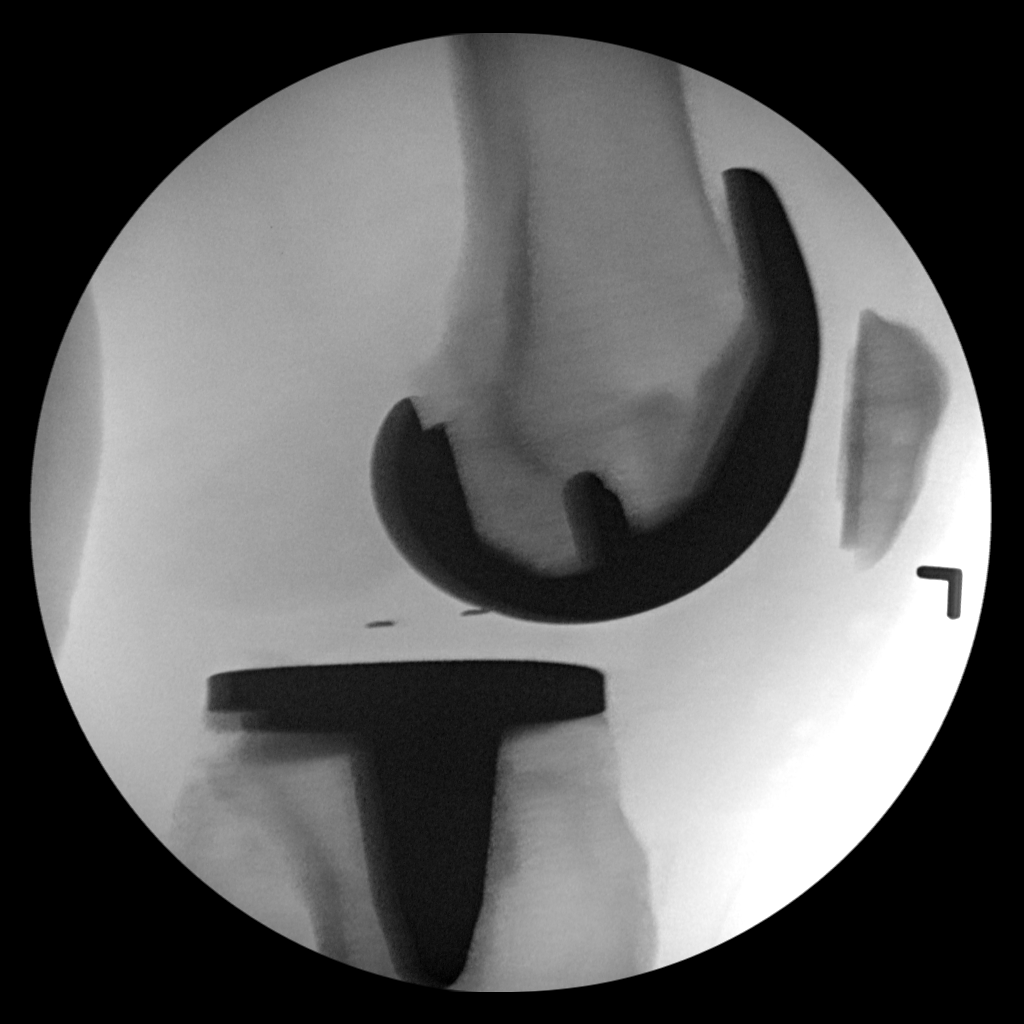
[im 4/4]
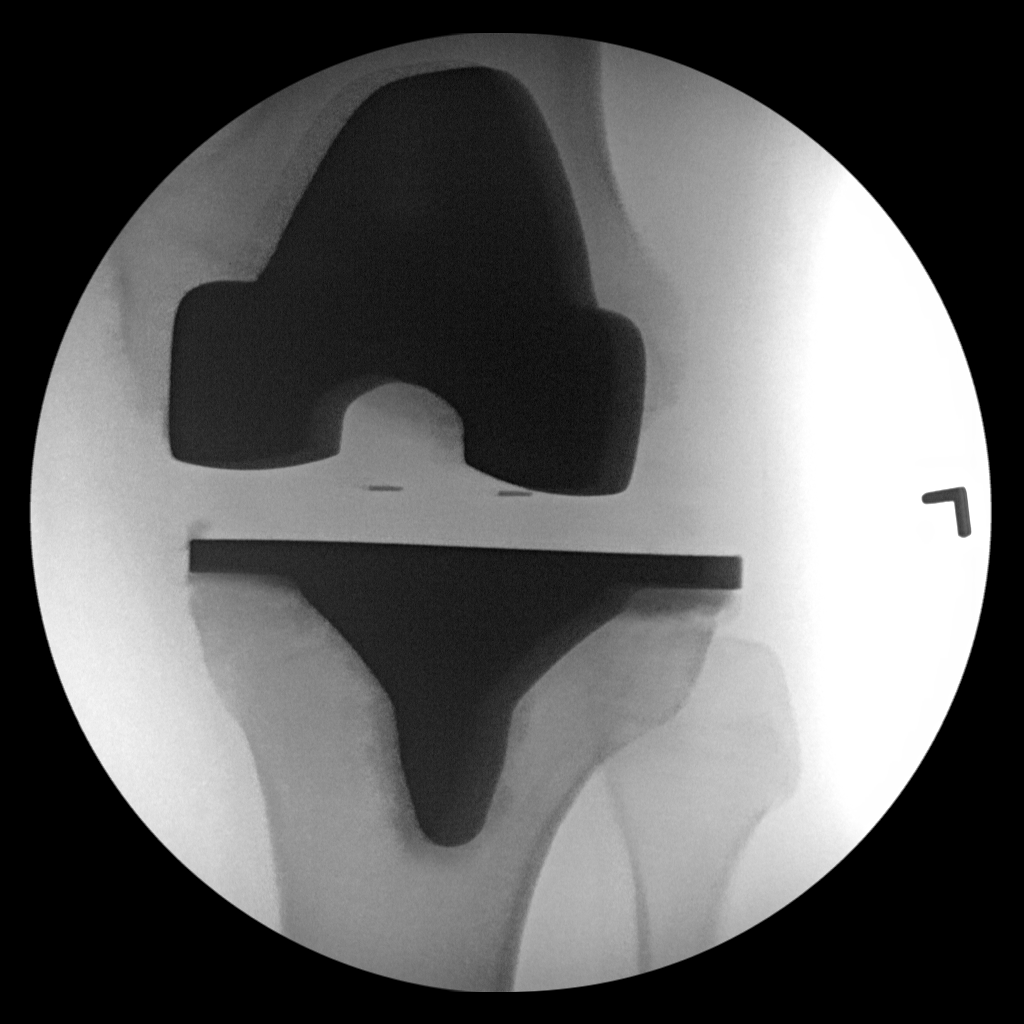

[4 of 4 positions shown; findings below may reference images not displayed]

FINDINGS: Patient has had a prior total knee replacement. Posterior
subluxation of the tibia is again noted on image number 4. Re-
alignment appears be present on image number 1 No fracture
identified .
IMPRESSION: Posterior subluxation of the tibia is again noted on image number 4.
Re- alignment appears to be present on image number 1. Prior total
knee replacement. Hardware intact. No fracture noted.
